# Patient Record
Sex: Female | Born: 1960 | Race: White | Hispanic: No | Smoking: Never smoker
Health system: Southern US, Community
[De-identification: ages and names within clinical notes are randomized; demographics above are authoritative.]

## PROBLEM LIST (undated history)

## (undated) DIAGNOSIS — K589 Irritable bowel syndrome without diarrhea: Secondary | ICD-10-CM

## (undated) DIAGNOSIS — K219 Gastro-esophageal reflux disease without esophagitis: Secondary | ICD-10-CM

## (undated) DIAGNOSIS — F419 Anxiety disorder, unspecified: Secondary | ICD-10-CM

## (undated) DIAGNOSIS — R42 Dizziness and giddiness: Secondary | ICD-10-CM

## (undated) DIAGNOSIS — K5190 Ulcerative colitis, unspecified, without complications: Secondary | ICD-10-CM

## (undated) DIAGNOSIS — N200 Calculus of kidney: Secondary | ICD-10-CM

## (undated) DIAGNOSIS — F329 Major depressive disorder, single episode, unspecified: Secondary | ICD-10-CM

## (undated) DIAGNOSIS — F32A Depression, unspecified: Secondary | ICD-10-CM

## (undated) DIAGNOSIS — E119 Type 2 diabetes mellitus without complications: Secondary | ICD-10-CM

## (undated) DIAGNOSIS — J329 Chronic sinusitis, unspecified: Secondary | ICD-10-CM

## (undated) HISTORY — DX: Ulcerative colitis, unspecified, without complications: K51.90

## (undated) HISTORY — DX: Depression, unspecified: F32.A

## (undated) HISTORY — DX: Irritable bowel syndrome, unspecified: K58.9

## (undated) HISTORY — DX: Major depressive disorder, single episode, unspecified: F32.9

## (undated) HISTORY — DX: Type 2 diabetes mellitus without complications: E11.9

## (undated) HISTORY — DX: Gastro-esophageal reflux disease without esophagitis: K21.9

## (undated) HISTORY — DX: Calculus of kidney: N20.0

## (undated) HISTORY — PX: NASAL ENDOSCOPY W/ BALLON SINUPLASTY: SHX2065

## (undated) HISTORY — DX: Anxiety disorder, unspecified: F41.9

## (undated) HISTORY — DX: Dizziness and giddiness: R42

## (undated) HISTORY — DX: Chronic sinusitis, unspecified: J32.9

---

## 2003-03-13 ENCOUNTER — Encounter: Payer: Self-pay | Admitting: Gastroenterology

## 2003-05-17 ENCOUNTER — Encounter (INDEPENDENT_AMBULATORY_CARE_PROVIDER_SITE_OTHER): Payer: Self-pay | Admitting: Gastroenterology

## 2004-01-16 ENCOUNTER — Encounter: Admission: RE | Admit: 2004-01-16 | Discharge: 2004-01-16 | Payer: Self-pay | Admitting: Gastroenterology

## 2005-10-21 ENCOUNTER — Ambulatory Visit: Payer: Self-pay | Admitting: Gastroenterology

## 2006-08-17 ENCOUNTER — Encounter: Payer: Self-pay | Admitting: Gastroenterology

## 2007-02-23 ENCOUNTER — Ambulatory Visit: Payer: Self-pay | Admitting: Gastroenterology

## 2008-09-06 DIAGNOSIS — K5190 Ulcerative colitis, unspecified, without complications: Secondary | ICD-10-CM | POA: Insufficient documentation

## 2008-09-07 ENCOUNTER — Ambulatory Visit: Payer: Self-pay | Admitting: Gastroenterology

## 2008-09-07 DIAGNOSIS — K589 Irritable bowel syndrome without diarrhea: Secondary | ICD-10-CM | POA: Insufficient documentation

## 2009-01-22 ENCOUNTER — Emergency Department: Payer: Self-pay | Admitting: Emergency Medicine

## 2009-12-16 ENCOUNTER — Ambulatory Visit: Payer: Self-pay | Admitting: Gastroenterology

## 2009-12-16 DIAGNOSIS — R198 Other specified symptoms and signs involving the digestive system and abdomen: Secondary | ICD-10-CM | POA: Insufficient documentation

## 2010-12-09 ENCOUNTER — Encounter (INDEPENDENT_AMBULATORY_CARE_PROVIDER_SITE_OTHER): Payer: Self-pay | Admitting: *Deleted

## 2011-01-06 NOTE — Assessment & Plan Note (Signed)
Summary: intestinal problems.Marland Kitchenem   History of Present Illness Visit Type: Follow-up Visit Primary GI MD: Joylene Igo MD [facility] Primary Provider: Jennette Bill, MD Chief Complaint: Patient c/o several weeks of what she describes as "coffee ground material" in her stool. She denies any dark black stool or brb. There is no constipation or diarrhea. She does, however complain of llq abdominal discomfort but attibutes this to increase in stress. 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 She was last seen in October 2009. She states she took Asacol for approximately one month after that visit and has been off this and off Robinul since that time with no gastrointestinal complaints.  For the past several weeks. She has noted intermittent small amounts of "coffee-ground material" on the tissue paper following a bowel movement. She notes no abnormalities in her stool and specifically denies any red blood or melena. She notes no diarrhea, constipation or mucus per rectum. She notes occasional mild left lower quadrant pain that appears to resolve following a bowel movement.   GI Review of Systems    Reports abdominal pain.     Location of  Abdominal pain: LLQ.    Denies acid reflux, belching, bloating, chest pain, dysphagia with liquids, dysphagia with solids, heartburn, loss of appetite, nausea, vomiting, vomiting blood, weight loss, and  weight gain.        Denies anal fissure, black tarry stools, change in bowel habit, constipation, diarrhea, diverticulosis, fecal incontinence, heme positive stool, hemorrhoids, irritable bowel syndrome, jaundice, light color stool, liver problems, rectal bleeding, and  rectal pain.   Current Medications (verified): 1)  Ortho-Cyclen (28) 0.25-35 Mg-Mcg Tabs (Norgestimate-Eth Estradiol) .... 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 Unremarkable  Family History: Reviewed history from 09/07/2008 and no changes required. 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  Alcohol Use - no Daily Caffeine OIZ-1-2 cups daily Illicit Drug Use - no Patient does not get regular exercise.   Review of Systems       The pertinent positives and negatives are noted as above and in the HPI. All other ROS were reviewed and were negative.   Vital Signs:  Patient profile:   50 year old female Height:      67 inches Weight:      137.38 pounds BMI:     21.59 BSA:     1.73 Pulse rate:   96 / minute Pulse rhythm:   regular BP sitting:   110 / 76  (left arm)  Vitals Entered By: Awilda Bill CMA Deborra Medina) (December 16, 2009 10:23 AM)  Physical Exam  General:  Well developed, well nourished, no acute distress. Head:  Normocephalic and atraumatic. Eyes:  PERRLA, no icterus. Ears:  Normal auditory acuity. Mouth:  No deformity or lesions, dentition normal. Lungs:  Clear throughout to auscultation. Heart:  Regular rate and rhythm; no murmurs, rubs,  or bruits. Abdomen:  Soft, nontender and nondistended. No masses, hepatosplenomegaly or hernias noted. Normal bowel sounds. Rectal:  Normal exam. hemocult negative.   Neurologic:  Alert and  oriented x4;  grossly normal neurologically.   Impression & Recommendations:  Problem # 1:  CHANGE IN BOWELS (WPY-099.83) Black "  coffee ground" material intermittently on the tissue paper. I'm uncertain as to the significance of these symptoms. Her diagnosis of ulcerative proctitis has not been well established as biopsies were not obtained. Trial of Asacol 800 mg t.i.d. If her symptoms persist for several more  weeks proceed with colonoscopy to further evaluate.  Problem # 2:  IRRITABLE BOWEL SYNDROME (ICD-564.1) Her mild left lower quadrant pain may be related to your double bowel syndrome. May use Robinul forte twice daily as needed.  Patient Instructions: 1)  Pick up your prescriptions at your pharmacy.  2)  Please continue current medications.  3)  Please schedule a follow-up appointment in 1 year and as needed 4)  Copy sent to : Jennette Bill, MD  Prescriptions: ROBINUL-FORTE 2 MG TABS (GLYCOPYRROLATE) one tablet by mouth two times a day  #60 x 11   Entered by:   Marlon Pel CMA ([place])   Authorized by:   Ladene Artist MD [facility]   Signed by:   Ladene Artist MD [facility] on 12/16/2009   Method used:   Electronically to        [facility]. [phone]* (retail)       [place], [place]  [address]       Ph: [phone] or [phone]       Fax: [phone]   RxID:   [id number] ASACOL HD 800 MG TBEC (MESALAMINE) one tablet by mouth three times a day  #90 x 11   Entered by:   Marlon Pel CMA ([place])   Authorized by:   Ladene Artist MD [facility]   Signed by:   Ladene Artist MD [facility] on 12/16/2009   Method used:   Electronically to        [facility]. [phone]* (retail)       [address] [place], [place]  [address]       Ph: [phone] or [phone]       Fax: [phone]   RxID:   [id number]

## 2011-01-06 NOTE — Letter (Signed)
Summary: [facility]   Imported By: Phillis Knack 12/16/2009 15:03:40  _____________________________________________________________________  External Attachment:    Type:   Image     Comment:   External Document

## 2011-01-06 NOTE — Letter (Signed)
Summary: NP/[facility]  NP/[facility]   Imported By: Phillis Knack 12/16/2009 15:01:48  _____________________________________________________________________  External Attachment:    Type:   Image     Comment:   External Document

## 2011-01-08 NOTE — Letter (Signed)
Summary: Office Visit Letter  [facility]  [address] [place], [place] [address]   Phone: [phone]  Fax: [phone]      December 09, 2010 MRN: [id number]   [name] [address] [place], [place]  [address]   Dear Ms. Nylen,   According to our records, it is time for you to schedule a follow-up office visit with [place].   At your convenience, please call [phone] (option #2)to schedule an office visit. If you have any questions, concerns, or feel that this letter is in error, we would appreciate your call.   Sincerely,  Norberto Sorenson T. Fuller Plan, M.D.  [facility] [phone]

## 2011-04-24 NOTE — Assessment & Plan Note (Signed)
[facility] OFFICE NOTE   MALEA, SWILLING                        MRN:          [id number]  DATE:02/23/2007                            DOB:          [birth year]    Tam says she is doing fine.  She takes Asacol one b.i.d. and some  Librax b.i.d., has no complaints, otherwise feeling good.   LABORATORY DATA:  All her labs were negative.   PHYSICAL EXAMINATION:  On physical today, she weighed 133, blood  pressure 112/80, pulse 88 and regular.  The rest is unremarkable.   IMPRESSION:  1. History of mild proctitis, doing well on the above medications.  2. IBS.  3. History of anxiety and depression.   RECOMMENDATIONS:  She is to continue with the same medications.  I gave  her prescriptions for this.  I told her that we would continue as is and  that she could transfer to Dr. Carlean Purl sometime in the near future.     Clarene Reamer, MD  Electronically Signed    SML/MedQ  DD: 02/23/2007  DT: 02/23/2007  Job #: [id number]

## 2012-03-02 ENCOUNTER — Encounter: Payer: Self-pay | Admitting: Gastroenterology

## 2012-08-22 ENCOUNTER — Encounter: Payer: Self-pay | Admitting: Gastroenterology

## 2012-10-10 ENCOUNTER — Encounter: Payer: Self-pay | Admitting: Gastroenterology

## 2012-10-10 ENCOUNTER — Ambulatory Visit (INDEPENDENT_AMBULATORY_CARE_PROVIDER_SITE_OTHER): Payer: BC Managed Care – PPO | Admitting: Gastroenterology

## 2012-10-10 VITALS — BP 130/78 | HR 84 | Ht 66.25 in | Wt 137.0 lb

## 2012-10-10 DIAGNOSIS — K5120 Ulcerative (chronic) proctitis without complications: Secondary | ICD-10-CM

## 2012-10-10 DIAGNOSIS — K589 Irritable bowel syndrome without diarrhea: Secondary | ICD-10-CM

## 2012-10-10 MED ORDER — CILIDINIUM-CHLORDIAZEPOXIDE 2.5-5 MG PO CAPS: 1.0000 | ORAL_CAPSULE | Freq: Three times a day (TID) | ORAL | Status: DC | PRN

## 2012-10-10 MED ORDER — GLYCOPYRROLATE 2 MG PO TABS: 2.0000 mg | ORAL_TABLET | Freq: Two times a day (BID) | ORAL | Status: DC | PRN

## 2012-10-10 MED ORDER — MESALAMINE 800 MG PO TBEC: 1.0000 | DELAYED_RELEASE_TABLET | Freq: Three times a day (TID) | ORAL | Status: DC

## 2012-10-10 MED ORDER — PEG-KCL-NACL-NASULF-NA ASC-C 100 G PO SOLR: 1.0000 | Freq: Once | ORAL | Status: DC

## 2012-10-10 NOTE — Patient Instructions (Addendum)
You have been scheduled for a colonoscopy with propofol. Please follow written instructions given to you at your visit today.  Please pick up your prep kit at the pharmacy within the next 1-3 days. If you use inhalers (even only as needed) or a CPAP machine, please bring them with you on the day of your procedure.  We have sent the following medications to your pharmacy for you to pick up at your convenience: Robinul and Asacol to try first then if no response from the Robinul, pick up Librax to take as needed.  cc: Dwana Melena, MD

## 2012-10-10 NOTE — Progress Notes (Signed)
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 Velora Heckler. She relates her prior symptoms were primarily that of left lower quadrant pain and rectal bleeding without diarrhea. She's also been treated for irritable bowel syndrome with episodic left lower quadrant pain. At this time she has had recurrent episodes of left lower quadrant pain that are associated with stress. I saw her last in 2011. She has been off all GI medications for about 2 years. Denies weight loss, constipation, diarrhea, change in stool caliber, melena, hematochezia, nausea, vomiting, dysphagia, reflux symptoms, chest pain.  Review of Systems: Pertinent positive and negative review of systems were noted in the above HPI section. All other review of systems were otherwise negative.  Current Medications, Allergies, Past Medical History, Past Surgical History, Family History and Social History were reviewed in [employer] record.  Physical Exam: General: Well developed , well nourished, no acute distress Head: Normocephalic and atraumatic Eyes:  sclerae anicteric, EOMI Ears: Normal auditory acuity Mouth: No deformity or lesions Neck: Supple, no masses or thyromegaly Lungs: Clear throughout to auscultation Heart: Regular rate and rhythm; no murmurs, rubs or bruits Abdomen: Soft, non tender and non distended. No masses, hepatosplenomegaly or hernias noted. Normal Bowel sounds Rectal: Deferred to colonoscopy Musculoskeletal: Symmetrical with no gross deformities  Skin: No lesions on visible extremities Pulses:  Normal pulses noted Extremities: No clubbing, cyanosis, edema or deformities noted Neurological: Alert oriented x 4, grossly nonfocal Cervical Nodes:  No significant cervical adenopathy Inguinal Nodes: No significant inguinal adenopathy Psychological:  Alert and cooperative. Normal mood and affect  Assessment and  Recommendations:  1. Ulcerative proctitis. She has not had bleeding but has had left lower quadrant pain and she would like to resume Asacol. Resume Asacol HD 800 mg 3 times a day. She is also due for colorectal cancer screening. Schedule colonoscopy. The risks, benefits, and alternatives to colonoscopy with possible biopsy and possible polypectomy were discussed with the patient and they consent to proceed.   2. Left lower quadrant pain associated with stress. Symptoms more typical for irritable bowel syndrome. Resume glycopyrrolate 2 mg twice a day when necessary. Librax 3 times a day when necessary if symptoms are refractory to Robinul.

## 2012-11-10 ENCOUNTER — Ambulatory Visit (AMBULATORY_SURGERY_CENTER): Payer: BC Managed Care – PPO | Admitting: Gastroenterology

## 2012-11-10 ENCOUNTER — Encounter: Payer: Self-pay | Admitting: Gastroenterology

## 2012-11-10 VITALS — BP 143/87 | HR 86 | Temp 97.9°F | Resp 20 | Ht 66.25 in | Wt 137.0 lb

## 2012-11-10 DIAGNOSIS — Z1211 Encounter for screening for malignant neoplasm of colon: Secondary | ICD-10-CM

## 2012-11-10 DIAGNOSIS — K5120 Ulcerative (chronic) proctitis without complications: Secondary | ICD-10-CM

## 2012-11-10 DIAGNOSIS — D126 Benign neoplasm of colon, unspecified: Secondary | ICD-10-CM

## 2012-11-10 LAB — HM COLONOSCOPY

## 2012-11-10 MED ORDER — SODIUM CHLORIDE 0.9 % IV SOLN: 500.0000 mL | INTRAVENOUS | Status: DC

## 2012-11-10 NOTE — Patient Instructions (Addendum)
YOU HAD AN ENDOSCOPIC PROCEDURE TODAY AT THE [facility]: Refer to the procedure report that was given to you for any specific questions about what was found during the examination.  If the procedure report does not answer your questions, please call your gastroenterologist to clarify.  If you requested that your care partner not be given the details of your procedure findings, then the procedure report has been included in a sealed envelope for you to review at your convenience later.  YOU SHOULD EXPECT: Some feelings of bloating in the abdomen. Passage of more gas than usual.  Walking can help get rid of the air that was put into your GI tract during the procedure and reduce the bloating. If you had a lower endoscopy (such as a colonoscopy or flexible sigmoidoscopy) you may notice spotting of blood in your stool or on the toilet paper. If you underwent a bowel prep for your procedure, then you may not have a normal bowel movement for a few days.  DIET: Your first meal following the procedure should be a light meal and then it is ok to progress to your normal diet.  A half-sandwich or bowl of soup is an example of a good first meal.  Heavy or fried foods are harder to digest and may make you feel nauseous or bloated.  Likewise meals heavy in dairy and vegetables can cause extra gas to form and this can also increase the bloating.  Drink plenty of fluids but you should avoid alcoholic beverages for 24 hours.  ACTIVITY: Your care partner should take you home directly after the procedure.  You should plan to take it easy, moving slowly for the rest of the day.  You can resume normal activity the day after the procedure however you should NOT DRIVE or use heavy machinery for 24 hours (because of the sedation medicines used during the test).    SYMPTOMS TO REPORT IMMEDIATELY: A gastroenterologist can be reached at any hour.  During normal business hours, 8:30 AM to 5:00 PM Monday through Friday,  call [phone].  After hours and on weekends, please call the GI answering service at [phone] who will take a message and have the physician on call contact you.   Following lower endoscopy (colonoscopy or flexible sigmoidoscopy):  Excessive amounts of blood in the stool  Significant tenderness or worsening of abdominal pains  Swelling of the abdomen that is new, acute  Fever of 100F or higher  Following upper endoscopy (EGD)  Vomiting of blood or coffee ground material  New chest pain or pain under the shoulder blades  Painful or persistently difficult swallowing  New shortness of breath  Fever of 100F or higher  Black, tarry-looking stools  FOLLOW UP: If any biopsies were taken you will be contacted by phone or by letter within the next 1-3 weeks.  Call your gastroenterologist if you have not heard about the biopsies in 3 weeks.  Our staff will call the home number listed on your records the next business day following your procedure to check on you and address any questions or concerns that you may have at that time regarding the information given to you following your procedure. This is a courtesy call and so if there is no answer at the home number and we have not heard from you through the emergency physician on call, we will assume that you have returned to your regular daily activities without incident.  SIGNATURES/CONFIDENTIALITY: You and/or your care   partner have signed paperwork which will be entered into your electronic medical record.  These signatures attest to the fact that that the information above on your After Visit Summary has been reviewed and is understood.  Full responsibility of the confidentiality of this discharge information lies with you and/or your care-partner.  

## 2012-11-10 NOTE — Op Note (Signed)
[facility]  [address] [place], [address]   COLONOSCOPY PROCEDURE REPORT  PATIENT: Christina, Bradley  MR#: [id number] BIRTHDATE: [birth year] , 51  yrs. old GENDER: Female ENDOSCOPIST: Ladene Artist, MD, [facility] PROCEDURE DATE:  11/10/2012 PROCEDURE:   Colonoscopy with biopsy ASA CLASS:   Class II INDICATIONS:elevated risk screening: high risk patient with previously diagnosed UC proctitis. MEDICATIONS: MAC sedation, administered by CRNA and propofol (Diprivan) 431m IV DESCRIPTION OF PROCEDURE:   After the risks benefits and alternatives of the procedure were thoroughly explained, informed consent was obtained.  A digital rectal exam revealed no abnormalities of the rectum.   The LB CF-[id number] endoscope was introduced through the anus and advanced to the cecum, which was identified by both the appendix and ileocecal valve. No adverse events experienced with a tortuous and elongated colon noted.   The quality of the prep was good, using MoviPrep  The instrument was then slowly withdrawn as the colon was fully examined.   COLON FINDINGS: A normal appearing cecum, ileocecal valve, and appendiceal orifice were identified.  The ascending, hepatic flexure, transverse, splenic flexure, descending, sigmoid colon and rectum appeared unremarkable.  No polyps or cancers were seen. Multiple random biopsies of the rectosigmoid colon were performed. Retroflexed views revealed no abnormalities. The time to cecum=5 minutes 56 seconds.  Withdrawal time=11 minutes 16 seconds.  The scope was withdrawn and the procedure completed.  COMPLICATIONS: There were no complications.  ENDOSCOPIC IMPRESSION: 1.  Normal colon; multiple random biopsies performed  RECOMMENDATIONS: 1.  Await pathology results 2.  Repeat colonoscopy in 5 years if biopsies show colitis; otherwise 10 years   eSigned:  MLadene Artist MD, [facility]12/04/2012 10:57 AM   cc: LAshok Norris MD

## 2012-11-10 NOTE — Progress Notes (Signed)
Patient did not experience any of the following events: a burn prior to discharge; a fall within the facility; wrong site/side/patient/procedure/implant event; or a hospital transfer or hospital admission upon discharge from the facility. (G8907) Patient did not have preoperative order for IV antibiotic SSI prophylaxis. (G8918)  

## 2012-11-10 NOTE — Progress Notes (Signed)
Propofol given over incremental dosages 

## 2012-11-11 ENCOUNTER — Telehealth: Payer: Self-pay | Admitting: *Deleted

## 2012-11-20 ENCOUNTER — Encounter: Payer: Self-pay | Admitting: Gastroenterology

## 2013-07-28 ENCOUNTER — Telehealth: Payer: Self-pay | Admitting: Gastroenterology

## 2013-07-28 DIAGNOSIS — K589 Irritable bowel syndrome without diarrhea: Secondary | ICD-10-CM

## 2013-07-28 DIAGNOSIS — K5120 Ulcerative (chronic) proctitis without complications: Secondary | ICD-10-CM

## 2013-07-28 MED ORDER — CILIDINIUM-CHLORDIAZEPOXIDE 2.5-5 MG PO CAPS: 1.0000 | ORAL_CAPSULE | Freq: Three times a day (TID) | ORAL | Status: DC | PRN

## 2013-07-28 MED ORDER — MESALAMINE 800 MG PO TBEC: 1.0000 | DELAYED_RELEASE_TABLET | Freq: Three times a day (TID) | ORAL | Status: DC

## 2013-07-28 NOTE — Telephone Encounter (Signed)
Prescription sent to patient's pharmacy.

## 2013-12-07 HISTORY — PX: NASAL SINUS SURGERY: SHX719

## 2013-12-22 ENCOUNTER — Ambulatory Visit: Payer: Self-pay | Admitting: Otolaryngology

## 2015-06-19 ENCOUNTER — Telehealth: Payer: Self-pay | Admitting: Gastroenterology

## 2015-06-19 NOTE — Telephone Encounter (Signed)
I was not able to speak with the patient.   I did give her a tentative appt for tomorrow at 9:30 with Nicoletta Ba PA  I left her a message that I was sorry I did not reach her earlier in the day, left her the appt info for tomorrow and asked that she call back if she could not make that appt at short notice.

## 2015-06-20 ENCOUNTER — Ambulatory Visit: Payer: Self-pay | Admitting: Physician Assistant

## 2015-06-20 NOTE — Telephone Encounter (Signed)
Patient called and not able to come this am.  She is rescheduled to tomorrow pm with Arta Bruce, PA

## 2015-06-21 ENCOUNTER — Other Ambulatory Visit (INDEPENDENT_AMBULATORY_CARE_PROVIDER_SITE_OTHER): Payer: BLUE CROSS/BLUE SHIELD

## 2015-06-21 ENCOUNTER — Ambulatory Visit (INDEPENDENT_AMBULATORY_CARE_PROVIDER_SITE_OTHER): Payer: BLUE CROSS/BLUE SHIELD | Admitting: Physician Assistant

## 2015-06-21 ENCOUNTER — Encounter: Payer: Self-pay | Admitting: Physician Assistant

## 2015-06-21 VITALS — BP 140/90 | HR 92 | Ht 66.0 in | Wt 136.0 lb

## 2015-06-21 DIAGNOSIS — K219 Gastro-esophageal reflux disease without esophagitis: Secondary | ICD-10-CM

## 2015-06-21 DIAGNOSIS — K589 Irritable bowel syndrome without diarrhea: Secondary | ICD-10-CM

## 2015-06-21 DIAGNOSIS — R109 Unspecified abdominal pain: Secondary | ICD-10-CM

## 2015-06-21 LAB — CBC WITH DIFFERENTIAL/PLATELET
BASOS ABS: 0.1 10*3/uL (ref 0.0–0.1)
Basophils Relative: 0.7 % (ref 0.0–3.0)
EOS ABS: 0.3 10*3/uL (ref 0.0–0.7)
EOS PCT: 2.7 % (ref 0.0–5.0)
HCT: 45.1 % (ref 36.0–46.0)
HEMOGLOBIN: 15.3 g/dL — AB (ref 12.0–15.0)
LYMPHS ABS: 2.4 10*3/uL (ref 0.7–4.0)
LYMPHS PCT: 19.8 % (ref 12.0–46.0)
MCHC: 33.9 g/dL (ref 30.0–36.0)
MCV: 92.6 fl (ref 78.0–100.0)
MONOS PCT: 5.5 % (ref 3.0–12.0)
Monocytes Absolute: 0.7 10*3/uL (ref 0.1–1.0)
NEUTROS PCT: 71.3 % (ref 43.0–77.0)
Neutro Abs: 8.5 10*3/uL — ABNORMAL HIGH (ref 1.4–7.7)
PLATELETS: 235 10*3/uL (ref 150.0–400.0)
RBC: 4.88 Mil/uL (ref 3.87–5.11)
RDW: 13.4 % (ref 11.5–15.5)
WBC: 11.9 10*3/uL — ABNORMAL HIGH (ref 4.0–10.5)

## 2015-06-21 LAB — SEDIMENTATION RATE: SED RATE: 9 mm/h (ref 0–22)

## 2015-06-21 MED ORDER — CILIDINIUM-CHLORDIAZEPOXIDE 2.5-5 MG PO CAPS: 1.0000 | ORAL_CAPSULE | Freq: Three times a day (TID) | ORAL | Status: DC | PRN

## 2015-06-21 MED ORDER — RABEPRAZOLE SODIUM 20 MG PO TBEC: DELAYED_RELEASE_TABLET | ORAL | Status: DC

## 2015-06-21 NOTE — Patient Instructions (Addendum)
Your physician has requested that you go to the basement for the following lab work before leaving today:CBC, Sed rate.   We have sent the following medications to your pharmacy for you to pick up at your convenience:Librax and rabeprazole.  Patient advised to avoid spicy, acidic, citrus, chocolate, mints, fruit and fruit juices.  Limit the intake of caffeine, alcohol and Soda.  Don't exercise too soon after eating.  Don't lie down within 3-4 hours of eating.  Elevate the head of your bed.  Increase your water intake.   Avoid eating 2 hours before bedtime.   Your follow up appointment with Dr. Fuller Plan is on 08/09/15 at 9:00am. If you need to reschedule or cancel please call our office at [phone].

## 2015-06-21 NOTE — Progress Notes (Signed)
Patient ID: Christina Bradley, female   DOB: Nov 29, 1961, 54 y.o.   MRN: [id number]     History of Present Illness: Christina Bradley is a pleasant 54 year old female who is known to Dr. Fuller Plan. 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 She reports that she had a sigmoidoscopy in 2004 but no biopsies were done. She states that she took asacol  for approximately 1 month in October 2009 with resolution of her symptoms of abdominal cramping and diarrhea. She was seen in 2011 with complaints of black "coffee ground" material intermittently on the tissue paper with bowel movements. She was again given a trial of these call 800 mg 3 times a day. She was prescribed Robinul for IBS. She had a colonoscopy in December 2013 which was a normal colonoscopy. Multiple random biopsies were performed and were normal tissue. She states that she has been feeling well and has not used [place] call in over 3-1/2 years. She has not used her Robinul Forte in over 3 years. She has not used her Tye Savoy in over 3 years. She is here today because she states that she notices that over the past 1-2 months when she eats she sometimes has bloating and left-sided cramping that last until she passes gas or has a bowel movement. When she wakes up in the morning she feels like she has a stitch in her left side that is alleviated with passage of gas or a bowel movement. She is unable to identify any specific foods that exacerbate her symptoms. She has no diarrhea. She is having one formed bowel movement daily. She has no bright red blood per rectum or melena. She rarely has mucus with her stools. Her appetite has been good and her weight has been stable. She does feel very gassy. She has no nausea but feels bloating across the upper abdomen. She has not belching or burping and denies heartburn. She denies nocturnal regurgitation but states for the past month when she wakes up in the morning she feels  like she has vomitus in her mouth. She denies early satiety and denies tenesmus.   Past Medical History  Diagnosis Date  . Depression     pt denies  . Kidney stones   . UC (ulcerative colitis)   . Anxiety     pt denies    Past Surgical History  Procedure Laterality Date  . Nasal endoscopy w/ ballon sinuplasty     Family History  Problem Relation Age of Onset  . Colon cancer Neg Hx    History  Substance Use Topics  . Smoking status: Never Smoker   . Smokeless tobacco: Never Used  . Alcohol Use: No   Current Outpatient Prescriptions  Medication Sig Dispense Refill  . clidinium-chlordiazePOXIDE (LIBRAX) 5-2.5 MG per capsule Take 1 capsule by mouth 3 (three) times daily as needed. 90 capsule 5  . RABEprazole (ACIPHEX) 20 MG tablet Take one tablet by mouth 30 minutes before breakfast 30 tablet 11   No current facility-administered medications for this visit.   No Known Allergies    Review of Systems: Gen: Denies any fever, chills, sweats, anorexia, fatigue, weakness, malaise, weight loss, and sleep disorder CV: Denies chest pain, angina, palpitations, syncope, orthopnea, PND, peripheral edema, and claudication. Resp: Denies dyspnea at rest, dyspnea with exercise, cough, sputum, wheezing, coughing up blood, and pleurisy. GI: Denies vomiting blood, jaundice, and fecal incontinence.   Denies dysphagia or odynophagia. GU :  Denies urinary burning, blood in urine, urinary frequency, urinary hesitancy, nocturnal urination, and urinary incontinence. MS: Denies joint pain, limitation of movement, and swelling, stiffness, low back pain, extremity pain. Denies muscle weakness, cramps, atrophy.  Derm: Denies rash, itching, dry skin, hives, moles, warts, or unhealing ulcers.  Psych: Denies depression, anxiety, memory loss, suicidal ideation, hallucinations, paranoia, and confusion. Heme: Denies bruising, bleeding, and enlarged lymph nodes. Neuro:  Denies any headaches, dizziness,  paresthesia Endo:  Denies any problems with DM, thyroid, adrenal  LAB RESULTS:  Recent Labs  06/21/15 1517  WBC 11.9*  HGB 15.3*  HCT 45.1  PLT 235.0     Physical Exam: General: Pleasant, well developed female in no acute distress Head: Normocephalic and atraumatic Eyes:  sclerae anicteric, conjunctiva pink  Ears: Normal auditory acuity Lungs: Clear throughout to auscultation Heart: Regular rate and rhythm Abdomen: Soft, non distended, non-tender. No masses, no hepatomegaly. Normal bowel sounds Musculoskeletal: Symmetrical with no gross deformities  Extremities: No edema  Neurological: Alert oriented x 4, grossly nonfocal Psychological:  Alert and cooperative. Normal mood and affect  Assessment and Recommendations: 54 year old female with a history of IBS and a questionable history of ulcerative colitis who has been off medications for several years, here for evaluation of 1-2 months of postprandial cramping and bloating. Her symptoms are likely functional in nature. A CBC, and ESR will be obtained today. She will use her Truman Hayward Brack's 3 times daily as needed. She's been instructed to adhere to a high-fiber low-fat diet. An antireflux regimen has been reviewed and she will be given a trial of rabeprazole 20 mg 1 by mouth every morning 30 minutes prior to breakfast. She's been instructed to avoid eating for 2 hours before bedtime. She will return for reevaluation in 6-8 weeks, sooner if needed.         Devon Pretty, Deloris Ping 06/21/2015,

## 2015-06-22 NOTE — Progress Notes (Signed)
Reviewed and agree with management plan.  Montrell Cessna T. Zyona Pettaway, MD FACG 

## 2015-06-24 ENCOUNTER — Other Ambulatory Visit: Payer: Self-pay | Admitting: *Deleted

## 2015-06-24 DIAGNOSIS — D72829 Elevated white blood cell count, unspecified: Secondary | ICD-10-CM

## 2015-06-25 ENCOUNTER — Other Ambulatory Visit (INDEPENDENT_AMBULATORY_CARE_PROVIDER_SITE_OTHER): Payer: BLUE CROSS/BLUE SHIELD

## 2015-06-25 DIAGNOSIS — D72829 Elevated white blood cell count, unspecified: Secondary | ICD-10-CM | POA: Diagnosis not present

## 2015-06-25 LAB — CBC WITH DIFFERENTIAL/PLATELET
BASOS PCT: 0.6 % (ref 0.0–3.0)
Basophils Absolute: 0 10*3/uL (ref 0.0–0.1)
EOS PCT: 7.8 % — AB (ref 0.0–5.0)
Eosinophils Absolute: 0.5 10*3/uL (ref 0.0–0.7)
HCT: 42.9 % (ref 36.0–46.0)
Hemoglobin: 14.5 g/dL (ref 12.0–15.0)
Lymphocytes Relative: 29.3 % (ref 12.0–46.0)
Lymphs Abs: 1.8 10*3/uL (ref 0.7–4.0)
MCHC: 33.8 g/dL (ref 30.0–36.0)
MCV: 93.1 fl (ref 78.0–100.0)
Monocytes Absolute: 0.5 10*3/uL (ref 0.1–1.0)
Monocytes Relative: 7.4 % (ref 3.0–12.0)
Neutro Abs: 3.4 10*3/uL (ref 1.4–7.7)
Neutrophils Relative %: 54.9 % (ref 43.0–77.0)
Platelets: 216 10*3/uL (ref 150.0–400.0)
RBC: 4.61 Mil/uL (ref 3.87–5.11)
RDW: 13.3 % (ref 11.5–15.5)
WBC: 6.3 10*3/uL (ref 4.0–10.5)

## 2015-08-09 ENCOUNTER — Ambulatory Visit: Payer: BLUE CROSS/BLUE SHIELD | Admitting: Gastroenterology

## 2015-08-16 ENCOUNTER — Ambulatory Visit (INDEPENDENT_AMBULATORY_CARE_PROVIDER_SITE_OTHER): Payer: BLUE CROSS/BLUE SHIELD | Admitting: Gastroenterology

## 2015-08-16 ENCOUNTER — Encounter: Payer: Self-pay | Admitting: Gastroenterology

## 2015-08-16 VITALS — BP 118/70 | HR 76 | Ht 66.25 in | Wt 136.5 lb

## 2015-08-16 DIAGNOSIS — K589 Irritable bowel syndrome without diarrhea: Secondary | ICD-10-CM

## 2015-08-16 DIAGNOSIS — R1032 Left lower quadrant pain: Secondary | ICD-10-CM

## 2015-08-16 NOTE — Progress Notes (Signed)
    History of Present Illness: This is a 54 year old female returning for follow-up of IBS. Currently symptoms are well controlled on Align daily and Librax prn. She took rabeprazole for one month but did not notice any improvement in symptoms. Align has provided significant relief of her symptoms and she has only used Librax once or twice over the past month. She notes left lower quadrant bloating and discomfort that is relieved by the passage of flatus. She notices these symptoms frequently in the mornings.  Current Medications, Allergies, Past Medical History, Past Surgical History, Family History and Social History were reviewed in [employer] record.  Physical Exam: General: Well developed , well nourished, no acute distress Head: Normocephalic and atraumatic Eyes:  sclerae anicteric, EOMI Ears: Normal auditory acuity Mouth: No deformity or lesions Lungs: Clear throughout to auscultation Heart: Regular rate and rhythm; no murmurs, rubs or bruits Abdomen: Soft, non tender and non distended. No masses, hepatosplenomegaly or hernias noted. Normal Bowel sounds Musculoskeletal: Symmetrical with no gross deformities  Pulses:  Normal pulses noted Extremities: No clubbing, cyanosis, edema or deformities noted Neurological: Alert oriented x 4, grossly nonfocal Psychological:  Alert and cooperative. Normal mood and affect  Assessment and Recommendations:  1. IBS. Continue Align daily and Librax tid prn. Gas-X 4 times a day when necessary. I answered several questions regarding long-term management. Follow-up in 1 year and as needed.  2. History of ulcerative proctitis. Currently no symptoms suggestive of ulcerative colitis/proctitis. No colitis activity at colonoscopy in December 2013.  I spent 15 minutes of face-to-face time with the patient. Greater than 50% of the time was spent counseling and coordinating care.

## 2015-08-16 NOTE — Patient Instructions (Addendum)
Please purchase the following medications over the counter and take as directed: Gas-X four times a day as needed  Please continue taking Align daily

## 2015-11-29 ENCOUNTER — Ambulatory Visit (INDEPENDENT_AMBULATORY_CARE_PROVIDER_SITE_OTHER): Payer: BLUE CROSS/BLUE SHIELD | Admitting: Family Medicine

## 2015-11-29 ENCOUNTER — Encounter: Payer: Self-pay | Admitting: Family Medicine

## 2015-11-29 VITALS — BP 142/96 | HR 100 | Temp 97.7°F | Resp 18 | Ht 67.0 in | Wt 139.3 lb

## 2015-11-29 DIAGNOSIS — K589 Irritable bowel syndrome without diarrhea: Secondary | ICD-10-CM

## 2015-11-29 DIAGNOSIS — F411 Generalized anxiety disorder: Secondary | ICD-10-CM | POA: Insufficient documentation

## 2015-11-29 DIAGNOSIS — IMO0001 Reserved for inherently not codable concepts without codable children: Secondary | ICD-10-CM | POA: Insufficient documentation

## 2015-11-29 DIAGNOSIS — R030 Elevated blood-pressure reading, without diagnosis of hypertension: Secondary | ICD-10-CM | POA: Diagnosis not present

## 2015-11-29 DIAGNOSIS — R1032 Left lower quadrant pain: Secondary | ICD-10-CM

## 2015-11-29 DIAGNOSIS — K219 Gastro-esophageal reflux disease without esophagitis: Secondary | ICD-10-CM | POA: Insufficient documentation

## 2015-11-29 MED ORDER — ALPRAZOLAM 0.5 MG PO TABS: 0.5000 mg | ORAL_TABLET | Freq: Every evening | ORAL | Status: DC | PRN

## 2015-11-29 MED ORDER — DULOXETINE HCL 30 MG PO CPEP
30.0000 mg | ORAL_CAPSULE | Freq: Every day | ORAL | Status: DC
Start: 2015-11-29 — End: 2016-02-27

## 2015-11-29 NOTE — Progress Notes (Signed)
Name: Christina Bradley   MRN: [id number]    DOB: October 31, 1961   Date:11/29/2015       Progress Note  Subjective  Chief Complaint  Chief Complaint  Patient presents with  . Irritable Bowel Syndrome    GI Doctor-want patient to see her Internal Medicine doctor thinking her GI problems are due to stress.   . Stress    Husband stress due to his poor health.      HPI  IBS : she has a history of ulcerative colitis, but seen by GI recently and advised to take something for anxiety to control her symptoms of pain. No diarrhea or constipation, occasionally mucus on stools, but no blood. She is taking Align and prn medication for spasms. Pain is described as sharp, and usually on the left side, it can last up to all day sometimes, improves when she passes flatus, also when she has a bowel movement. She states episodes are intermittent, but more frequent since June. No bladder issues, no previous history of kidney stones. She states symptoms are a little better than during the Summer  Stress/Anxiety: her husband has multiple medication issues and she tries to do everything for him, she worries. GAD filled out.   White Coat Hypertension: she states that bp is up when she goes to doctors office, but usually normal range. Last bp at GI doctors was 118/70, it is her first visit in our office.    Patient Active Problem List   Diagnosis Date Noted  . White coat hypertension 11/29/2015  . IBS (irritable bowel syndrome) 09/07/2008  . Ulcerative colitis ([place]) 09/06/2008    Past Surgical History  Procedure Laterality Date  . Nasal endoscopy w/ ballon sinuplasty    . Nasal sinus surgery  2015    Family History  Problem Relation Age of Onset  . Colon cancer Neg Hx     Social History   Social History  . Marital Status: Married    Spouse Name: N/A  . Number of Children: 2  . Years of Education: N/A   Occupational History  . Administrative    Social History Main Topics  . Smoking status: Never  Smoker   . Smokeless tobacco: Never Used  . Alcohol Use: 0.0 oz/week    0 Standard drinks or equivalent per week     Comment: occasionally   . Drug Use: No  . Sexual Activity:    Partners: Male   Other Topics Concern  . Not on file   Social History Narrative     Current outpatient prescriptions:  .  clidinium-chlordiazePOXIDE (LIBRAX) 5-2.5 MG per capsule, Take 1 capsule by mouth 3 (three) times daily as needed., Disp: 90 capsule, Rfl: 5 .  Probiotic Product (ALIGN PO), Take by mouth., Disp: , Rfl:  .  RABEprazole (ACIPHEX) 20 MG tablet, TK 1 T PO 30 MIN B BRE, Disp: , Rfl:   No Known Allergies   ROS  Constitutional: Negative for fever or weight change.  Respiratory: Negative for cough and shortness of breath.   Cardiovascular: Negative for chest pain or palpitations.  Gastrointestinal: Positive for abdominal pain, no bowel changes.  Musculoskeletal: Negative for gait problem or joint swelling.  Skin: Negative for rash.  Neurological: Negative for dizziness or headache.  No other specific complaints in a complete review of systems (except as listed in HPI above).  Objective  Filed Vitals:   11/29/15 1523  BP: 142/96  Pulse: 100  Temp: 97.7 F (36.5 C)  TempSrc: Oral  Resp: 18  Height: 5' 7"  (1.702 m)  Weight: 139 lb 4.8 oz (63.186 kg)  SpO2: 97%    Body mass index is 21.81 kg/(m^2).  Physical Exam  Constitutional: Patient appears well-developed and well-nourished. No distress.  HEENT: head atraumatic, normocephalic, pupils equal and reactive to light,  neck supple, throat within normal limits Cardiovascular: Normal rate, regular rhythm and normal heart sounds.  No murmur heard. No BLE edema. Pulmonary/Chest: Effort normal and breath sounds normal. No respiratory distress. Abdominal: Soft.  Mild fullness and tenderness on LLQ, otherwise normal exam Psychiatric: Patient has a normal mood and affect. behavior is normal. Judgment and thought content  normal.  PHQ2/9: Depression screen PHQ 2/9 11/29/2015  Decreased Interest 0  Down, Depressed, Hopeless 0  PHQ - 2 Score 0    Fall Risk: Fall Risk  11/29/2015  Falls in the past year? No    Functional Status Survey: Is the patient deaf or have difficulty hearing?: No Does the patient have difficulty seeing, even when wearing glasses/contacts?: Yes (glasses) Does the patient have difficulty concentrating, remembering, or making decisions?: No Does the patient have difficulty walking or climbing stairs?: No Does the patient have difficulty dressing or bathing?: No Does the patient have difficulty doing errands alone such as visiting a doctor's office or shopping?: No    Assessment & Plan   1. IBS (irritable bowel syndrome)  - DULoxetine (CYMBALTA) 30 MG capsule; Take 1-2 capsules (30-60 mg total) by mouth daily. And after the first go up to two daily  Dispense: 60 capsule; Refill: 0  2. White coat hypertension  Monitor bp   3. GAD (generalized anxiety disorder)  - DULoxetine (CYMBALTA) 30 MG capsule; Take 1-2 capsules (30-60 mg total) by mouth daily. And after the first go up to two daily  Dispense: 60 capsule; Refill: 0 - ALPRAZolam (XANAX) 0.5 MG tablet; Take 1 tablet (0.5 mg total) by mouth at bedtime as needed for anxiety.  Dispense: 30 tablet; Refill: 0  4. Left lower quadrant pain  - US Pelvis Complete; Future

## 2015-12-04 ENCOUNTER — Ambulatory Visit
Admission: RE | Admit: 2015-12-04 | Discharge: 2015-12-04 | Disposition: A | Discharge status: Home / Self Care | Payer: BLUE CROSS/BLUE SHIELD | Source: Ambulatory Visit | Attending: Family Medicine | Admitting: Family Medicine

## 2015-12-04 DIAGNOSIS — K589 Irritable bowel syndrome without diarrhea: Secondary | ICD-10-CM | POA: Diagnosis not present

## 2015-12-04 DIAGNOSIS — R1032 Left lower quadrant pain: Secondary | ICD-10-CM

## 2016-02-27 ENCOUNTER — Other Ambulatory Visit: Payer: Self-pay | Admitting: Family Medicine

## 2016-09-10 ENCOUNTER — Other Ambulatory Visit: Payer: Self-pay | Admitting: Family Medicine

## 2016-09-11 NOTE — Telephone Encounter (Signed)
Patient requesting refill of Cymbalta to [facility].

## 2016-11-18 ENCOUNTER — Encounter: Payer: Self-pay | Admitting: Family Medicine

## 2016-11-18 ENCOUNTER — Ambulatory Visit (INDEPENDENT_AMBULATORY_CARE_PROVIDER_SITE_OTHER): Payer: BLUE CROSS/BLUE SHIELD | Admitting: Family Medicine

## 2016-11-18 ENCOUNTER — Ambulatory Visit: Payer: BLUE CROSS/BLUE SHIELD | Admitting: Family Medicine

## 2016-11-18 VITALS — BP 132/68 | HR 103 | Temp 98.8°F | Resp 16 | Ht 67.0 in | Wt 148.0 lb

## 2016-11-18 DIAGNOSIS — K589 Irritable bowel syndrome without diarrhea: Secondary | ICD-10-CM | POA: Diagnosis not present

## 2016-11-18 DIAGNOSIS — K5180 Other ulcerative colitis without complications: Secondary | ICD-10-CM

## 2016-11-18 DIAGNOSIS — F411 Generalized anxiety disorder: Secondary | ICD-10-CM

## 2016-11-18 MED ORDER — DULOXETINE HCL 30 MG PO CPEP: 30.0000 mg | ORAL_CAPSULE | Freq: Every day | ORAL | 0 refills | Status: DC

## 2016-11-18 NOTE — Progress Notes (Signed)
Name: Christina Bradley   MRN: [id number]    DOB: June 06, 1961   Date:11/18/2016       Progress Note  Subjective  Chief Complaint  Chief Complaint  Patient presents with  . Medication Refill  . Anxiety  . Irritable Bowel Syndrome    HPI  IBS : she has a history of ulcerative colitis, but when she was seen  by GI in 2016 she was  advised to take something for anxiety to control her symptoms of recurrent abdominal  pain. No diarrhea or constipation, occasionally mucus on stools, but no blood. She was taking Align and prn medication for spasms. Pain is described as sharp, and usually on the left side, it can last up to all day sometimes, improves when she passes flatus, also when she has a bowel movement. She states episodes are intermittent, but were more frequent the end of 2016. Symptoms seems to be aggravated by stress and not associated with  bladder issues, no previous history of kidney stones. We gave her Cymbalta 30 mg, December 2017 to try to titrate up to 60 mg. She states she is feeling very well on medication. She had only 4 episodes of abdominal pain over the past year. She takes prn - when stressed, but when she takes it she takes it for a prolonged period of time. She is aware that medication takes time to kick in her system. She would like a refills. She denies sadness or panic attacks. Never filled rx of alprazolam   GAD: doing very well on Cymbalta, worries less often , coping better with medication and no side effects. Advised to take medication and titrate up to 60 mg daily if stress increases  Ulcerative colitis: no current symptoms. Sees GI  Patient Active Problem List   Diagnosis Date Noted  . White coat hypertension 11/29/2015  . GAD (generalized anxiety disorder) 11/29/2015  . GERD without esophagitis 11/29/2015  . IBS (irritable bowel syndrome) 09/07/2008  . Ulcerative colitis ([place]) 09/06/2008    Past Surgical History:  Procedure Laterality Date  . NASAL ENDOSCOPY W/  BALLON SINUPLASTY    . NASAL SINUS SURGERY  2015    Family History  Problem Relation Age of Onset  . Hyperlipidemia Mother   . Heart disease Mother     rapid heart beat  . COPD Father   . Colon cancer Neg Hx     Social History   Social History  . Marital status: Married    Spouse name: N/A  . Number of children: 2  . Years of education: N/A   Occupational History  . Administrative    Social History Main Topics  . Smoking status: Never Smoker  . Smokeless tobacco: Never Used  . Alcohol use 1.2 oz/week    2 Glasses of wine per week     Comment: occasionally   . Drug use: No  . Sexual activity: Yes    Partners: Male   Other Topics Concern  . Not on file   Social History Narrative  . No narrative on file     Current Outpatient Prescriptions:  .  DULoxetine (CYMBALTA) 30 MG capsule, Take 1-2 capsules (30-60 mg total) by mouth daily., Disp: 180 capsule, Rfl: 0 .  Probiotic Product (ALIGN PO), Take by mouth., Disp: , Rfl:  .  clidinium-chlordiazePOXIDE (LIBRAX) 5-2.5 MG per capsule, Take 1 capsule by mouth 3 (three) times daily as needed. (Patient not taking: Reported on 11/18/2016), Disp: 90 capsule, Rfl: 5  No  Known Allergies   ROS  Constitutional: Negative for fever or weight change.  Respiratory: Negative for cough and shortness of breath.   Cardiovascular: Negative for chest pain or palpitations.  Gastrointestinal: Negative for abdominal pain, no bowel changes.  Musculoskeletal: Negative for gait problem or joint swelling.  Skin: Negative for rash.  Neurological: Negative for dizziness or headache.  No other specific complaints in a complete review of systems (except as listed in HPI above).  Objective  Vitals:   11/18/16 1224  BP: 132/68  Pulse: (!) 103  Resp: 16  Temp: 98.8 F (37.1 C)  SpO2: 97%  Weight: 148 lb (67.1 kg)  Height: 5' 7"  (1.702 m)    Body mass index is 23.18 kg/m.  Physical Exam  Constitutional: Patient appears  well-developed and well-nourished.  No distress.  HEENT: head atraumatic, normocephalic, pupils equal and reactive to light, neck supple, throat within normal limits Cardiovascular: Normal rate, regular rhythm and normal heart sounds.  No murmur heard. No BLE edema. Pulmonary/Chest: Effort normal and breath sounds normal. No respiratory distress. Abdominal: Soft.  There is no tenderness. Psychiatric: Patient has a normal mood and affect. behavior is normal. Judgment and thought content normal.  PHQ2/9: Depression screen [facility] 2/9 11/18/2016 11/29/2015  Decreased Interest 0 0  Down, Depressed, Hopeless 0 0  PHQ - 2 Score 0 0    Fall Risk: Fall Risk  11/18/2016 11/29/2015  Falls in the past year? No No     Functional Status Survey: Is the patient deaf or have difficulty hearing?: No Does the patient have difficulty seeing, even when wearing glasses/contacts?: No Does the patient have difficulty concentrating, remembering, or making decisions?: No Does the patient have difficulty walking or climbing stairs?: No Does the patient have difficulty dressing or bathing?: No Does the patient have difficulty doing errands alone such as visiting a doctor's office or shopping?: No    Assessment & Plan  1. GAD (generalized anxiety disorder)  - DULoxetine (CYMBALTA) 30 MG capsule; Take 1-2 capsules (30-60 mg total) by mouth daily.  Dispense: 180 capsule; Refill: 0  2. Irritable bowel syndrome, unspecified type  Doing well on Cymbalta  3. Other ulcerative colitis without complication (HCC)  Seeing GI, going well

## 2016-12-16 ENCOUNTER — Ambulatory Visit: Payer: BLUE CROSS/BLUE SHIELD | Admitting: Family Medicine

## 2017-02-09 ENCOUNTER — Telehealth: Payer: Self-pay | Admitting: Emergency Medicine

## 2017-02-09 NOTE — Telephone Encounter (Signed)
Patient notified

## 2017-02-09 NOTE — Telephone Encounter (Signed)
I don't call in antibiotics. I am sorry

## 2017-02-09 NOTE — Telephone Encounter (Signed)
Have a sinus infection. Can a Zpak be called into pharmacy.

## 2017-08-11 ENCOUNTER — Other Ambulatory Visit: Payer: Self-pay | Admitting: Family Medicine

## 2017-08-11 DIAGNOSIS — F411 Generalized anxiety disorder: Secondary | ICD-10-CM

## 2017-08-11 MED ORDER — DULOXETINE HCL 30 MG PO CPEP: 30.0000 mg | ORAL_CAPSULE | Freq: Every day | ORAL | 0 refills | Status: DC

## 2017-08-13 ENCOUNTER — Other Ambulatory Visit: Payer: Self-pay | Admitting: Family Medicine

## 2017-08-13 ENCOUNTER — Other Ambulatory Visit: Payer: Self-pay

## 2017-08-13 DIAGNOSIS — F411 Generalized anxiety disorder: Secondary | ICD-10-CM

## 2017-08-13 MED ORDER — DULOXETINE HCL 30 MG PO CPEP: 30.0000 mg | ORAL_CAPSULE | Freq: Every day | ORAL | 0 refills | Status: DC

## 2017-08-13 NOTE — Progress Notes (Signed)
Patient called and states Dr. Ancil Boozer told patient she would only have to come once a year. She has an upcoming appointment in December 2018. Please cancel the 30 day prescription at [facility] and send in a new prescription.  Sent in new prescription per Dr. Ancil Boozer verbally to [facility] pharmacy to [place] for 90 pills of Duloxetine with 0 refills.

## 2017-08-13 NOTE — Telephone Encounter (Signed)
Patient requesting refill of Duloxetine to [facility] pharmacy.

## 2017-12-13 ENCOUNTER — Ambulatory Visit (INDEPENDENT_AMBULATORY_CARE_PROVIDER_SITE_OTHER): Payer: 59 | Admitting: Family Medicine

## 2017-12-13 ENCOUNTER — Encounter: Payer: Self-pay | Admitting: Family Medicine

## 2017-12-13 VITALS — BP 128/76 | HR 108 | Temp 97.9°F | Resp 16 | Ht 66.25 in | Wt 156.6 lb

## 2017-12-13 DIAGNOSIS — Z124 Encounter for screening for malignant neoplasm of cervix: Secondary | ICD-10-CM

## 2017-12-13 DIAGNOSIS — K5180 Other ulcerative colitis without complications: Secondary | ICD-10-CM | POA: Diagnosis not present

## 2017-12-13 DIAGNOSIS — Z01419 Encounter for gynecological examination (general) (routine) without abnormal findings: Secondary | ICD-10-CM

## 2017-12-13 DIAGNOSIS — Z01411 Encounter for gynecological examination (general) (routine) with abnormal findings: Secondary | ICD-10-CM | POA: Diagnosis not present

## 2017-12-13 DIAGNOSIS — J329 Chronic sinusitis, unspecified: Secondary | ICD-10-CM | POA: Diagnosis not present

## 2017-12-13 DIAGNOSIS — K589 Irritable bowel syndrome without diarrhea: Secondary | ICD-10-CM

## 2017-12-13 DIAGNOSIS — Z1239 Encounter for other screening for malignant neoplasm of breast: Secondary | ICD-10-CM

## 2017-12-13 DIAGNOSIS — F411 Generalized anxiety disorder: Secondary | ICD-10-CM | POA: Diagnosis not present

## 2017-12-13 MED ORDER — DULOXETINE HCL 30 MG PO CPEP: 30.0000 mg | ORAL_CAPSULE | Freq: Every day | ORAL | 4 refills | Status: DC

## 2017-12-13 NOTE — Patient Instructions (Signed)
Preventive Care 40-64 Years, Female Preventive care refers to lifestyle choices and visits with your health care provider that can promote health and wellness. What does preventive care include?  A yearly physical exam. This is also called an annual well check.  Dental exams once or twice a year.  Routine eye exams. Ask your health care provider how often you should have your eyes checked.  Personal lifestyle choices, including: ? Daily care of your teeth and gums. ? Regular physical activity. ? Eating a healthy diet. ? Avoiding tobacco and drug use. ? Limiting alcohol use. ? Practicing safe sex. ? Taking low-dose aspirin daily starting at age 58. ? Taking vitamin and mineral supplements as recommended by your health care provider. What happens during an annual well check? The services and screenings done by your health care provider during your annual well check will depend on your age, overall health, lifestyle risk factors, and family history of disease. Counseling Your health care provider may ask you questions about your:  Alcohol use.  Tobacco use.  Drug use.  Emotional well-being.  Home and relationship well-being.  Sexual activity.  Eating habits.  Work and work Statistician.  Method of birth control.  Menstrual cycle.  Pregnancy history.  Screening You may have the following tests or measurements:  Height, weight, and BMI.  Blood pressure.  Lipid and cholesterol levels. These may be checked every 5 years, or more frequently if you are over 81 years old.  Skin check.  Lung cancer screening. You may have this screening every year starting at age 78 if you have a 30-pack-year history of smoking and currently smoke or have quit within the past 15 years.  Fecal occult blood test (FOBT) of the stool. You may have this test every year starting at age 65.  Flexible sigmoidoscopy or colonoscopy. You may have a sigmoidoscopy every 5 years or a colonoscopy  every 10 years starting at age 30.  Hepatitis C blood test.  Hepatitis B blood test.  Sexually transmitted disease (STD) testing.  Diabetes screening. This is done by checking your blood sugar (glucose) after you have not eaten for a while (fasting). You may have this done every 1-3 years.  Mammogram. This may be done every 1-2 years. Talk to your health care provider about when you should start having regular mammograms. This may depend on whether you have a family history of breast cancer.  BRCA-related cancer screening. This may be done if you have a family history of breast, ovarian, tubal, or peritoneal cancers.  Pelvic exam and Pap test. This may be done every 3 years starting at age 80. Starting at age 36, this may be done every 5 years if you have a Pap test in combination with an HPV test.  Bone density scan. This is done to screen for osteoporosis. You may have this scan if you are at high risk for osteoporosis.  Discuss your test results, treatment options, and if necessary, the need for more tests with your health care provider. Vaccines Your health care provider may recommend certain vaccines, such as:  Influenza vaccine. This is recommended every year.  Tetanus, diphtheria, and acellular pertussis (Tdap, Td) vaccine. You may need a Td booster every 10 years.  Varicella vaccine. You may need this if you have not been vaccinated.  Zoster vaccine. You may need this after age 5.  Measles, mumps, and rubella (MMR) vaccine. You may need at least one dose of MMR if you were born in  1957 or later. You may also need a second dose.  Pneumococcal 13-valent conjugate (PCV13) vaccine. You may need this if you have certain conditions and were not previously vaccinated.  Pneumococcal polysaccharide (PPSV23) vaccine. You may need one or two doses if you smoke cigarettes or if you have certain conditions.  Meningococcal vaccine. You may need this if you have certain  conditions.  Hepatitis A vaccine. You may need this if you have certain conditions or if you travel or work in places where you may be exposed to hepatitis A.  Hepatitis B vaccine. You may need this if you have certain conditions or if you travel or work in places where you may be exposed to hepatitis B.  Haemophilus influenzae type b (Hib) vaccine. You may need this if you have certain conditions.  Talk to your health care provider about which screenings and vaccines you need and how often you need them. This information is not intended to replace advice given to you by your health care provider. Make sure you discuss any questions you have with your health care provider. Document Released: 12/20/2015 Document Revised: 08/12/2016 Document Reviewed: 09/24/2015 Elsevier Interactive Patient Education  2018 [employer]  

## 2017-12-13 NOTE — Progress Notes (Signed)
Name: Christina Bradley   MRN: [id number]    DOB: 07/08/1961   Date:12/13/2017       Progress Note  Subjective  Chief Complaint  Chief Complaint  Patient presents with  . Annual Exam  . Depression    HPI   CPE:   She is feeling well, due for labs.   IBS/Ulcerative colitis : she has a history of ulcerative colitis, but when she was seen  by GI in 2016 - Dr. Fuller Plan  she was  advised to take something for anxiety to control her symptoms of recurrent abdominal  pain. She denies current  diarrhea or constipation, occasionally mucus on stools - usually when stressed , but no blood. .She has intermittent abdominal pain that is described as sharp, and usually on the left side, it can last up to all day sometimes, improves when she passes flatus, also when she has a bowel movement.  Symptoms seems to be aggravated by stress and not associated with  bladder issues, no previous history of kidney stones. We gave her Cymbalta 30 mg, December 2017 she only takes it prn, for about one week and never went up on the dose. She states she is feeling very well on medication. She states at most three episodes of abdominal pain in 2018, she is due for repeat colonoscopy   GAD: doing very well on Cymbalta, worries less often , coping better with medication and no side effects, she only takes it prn , but willing to take more often since her mind is busy at night.   Ulcerative colitis: no current symptoms. Sees GI  Recurrent sinusitis: she was doing well after sinusplasty in 2015 however over the past 6 months she noticed facial pressure and congestion again and would like to go back to Dr. Kathyrn Sheriff, she is on the second round of augmentin that she got filled from the local urgent care   USPSTF grade A and B recommendations  Depression:  Depression screen [facility] 2/9 12/13/2017 11/18/2016 11/29/2015  Decreased Interest 0 0 0  Down, Depressed, Hopeless 0 0 0  PHQ - 2 Score 0 0 0   Hypertension: BP Readings from Last 3  Encounters:  12/13/17 128/76  11/18/16 132/68  11/29/15 (!) 142/96   Obesity: Wt Readings from Last 3 Encounters:  11/18/16 148 lb (67.1 kg)  11/29/15 139 lb 4.8 oz (63.2 kg)  08/16/15 136 lb 8 oz (61.9 kg)   BMI Readings from Last 3 Encounters:  12/13/17 23.71 kg/m  11/18/16 23.18 kg/m  11/29/15 21.82 kg/m    Alcohol: very seldom  Tobacco use: negative  HIV, hep B, hep C: declined  STD testing and prevention (chl/gon/syphilis): not interested and not symptoms Intimate partner violence:negative screen.  Sexual History/Pain during Intercourse: no pain during intercourse, normal libido Menstrual History/LMP/Abnormal Bleeding: LMP at age 24 yo, no post-menopausal bleeding Incontinence Symptoms: negative screen    Advanced Care Planning: A voluntary discussion about advance care planning including the explanation and discussion of advance directives.  Discussed health care proxy and Living will, and the patient was able to identify a health care proxy as husband - Joe  Patient does not have a living will at present time. If patient does have living will, I have requested they bring this to the clinic to be scanned in to their chart.  Breast cancer:had at [facility] gyn in 2017 , she would like to go to [facility] now  BRCA gene screening: not a candidate Cervical cancer screening: due  for repeat, last one done through gyn in over one year   Fall prevention/vitamin D: discussed sources of vitamin D and calcium  Lipids:  No results found for: CHOL No results found for: HDL No results found for: LDLCALC No results found for: TRIG No results found for: CHOLHDL No results found for: LDLDIRECT  Glucose:  No results found for: GLUCOSE, GLUCAP  Skin cancer: no atypical moles Colorectal cancer: 57 years old. Lung cancer:   Low Dose CT Chest recommended if Age 4-80 years, 30 pack-year currently smoking OR have quit w/in 15years. Patient does not qualify.   Aspirin:  81 mg daily  discussed today based on USPTF QMG:QQPYPPJ today    Patient Active Problem List   Diagnosis Date Noted  . White coat hypertension 11/29/2015  . GAD (generalized anxiety disorder) 11/29/2015  . GERD without esophagitis 11/29/2015  . IBS (irritable bowel syndrome) 09/07/2008  . Ulcerative colitis ([place]) 09/06/2008    Past Surgical History:  Procedure Laterality Date  . NASAL ENDOSCOPY W/ BALLON SINUPLASTY    . NASAL SINUS SURGERY  2015    Family History  Problem Relation Age of Onset  . Hyperlipidemia Mother   . COPD Father   . Colon cancer Neg Hx     Social History   Socioeconomic History  . Marital status: Married    Spouse name: Joe  . Number of children: 2  . Years of education: Not on file  . Highest education level: Not on file  Social Needs  . Financial resource strain: Not hard at all  . Food insecurity - worry: Never true  . Food insecurity - inability: Never true  . Transportation needs - medical: No  . Transportation needs - non-medical: No  Occupational History  . Occupation: Data processing manager  Tobacco Use  . Smoking status: Never Smoker  . Smokeless tobacco: Never Used  Substance and Sexual Activity  . Alcohol use: Yes    Alcohol/week: 0.6 oz    Types: 1 Glasses of wine per week    Comment: occasionally   . Drug use: No  . Sexual activity: Yes    Partners: Male    Birth control/protection: Post-menopausal  Other Topics Concern  . Not on file  Social History Narrative   Works three 10 hours days at [employer] and twice a week she watches her grandchildren.      Current Outpatient Medications:  .  amoxicillin-clavulanate (AUGMENTIN) 875-125 MG tablet, Take by mouth., Disp: , Rfl:  .  DULoxetine (CYMBALTA) 30 MG capsule, Take 1 capsule (30 mg total) by mouth daily., Disp: 90 capsule, Rfl: 4  No Known Allergies   ROS   Constitutional: Negative for fever or weight change.  Respiratory: Negative for cough and shortness of breath.    Cardiovascular: Negative for chest pain or palpitations.  Gastrointestinal: Negative for abdominal pain, no bowel changes.  Musculoskeletal: Negative for gait problem or joint swelling.  Skin: Negative for rash.  Neurological: Negative for dizziness or headache.  No other specific complaints in a complete review of systems (except as listed in HPI above).   Objective  Vitals:   12/13/17 1326  BP: 128/76  Pulse: (!) 108  Resp: 16  Temp: 97.9 F (36.6 C)  TempSrc: Oral  SpO2: 97%  Height: 5' 6.25" (1.683 m)    Body mass index is 23.71 kg/m.  Physical Exam  Constitutional: Patient appears well-developed and well-nourished. No distress.  HENT: Head: Normocephalic and atraumatic. Ears: B TMs ok, no  erythema or effusion; Nose: Nose normal. Mouth/Throat: Oropharynx is clear and moist. No oropharyngeal exudate.  Eyes: Conjunctivae and EOM are normal. Pupils are equal, round, and reactive to light. No scleral icterus.  Neck: Normal range of motion. Neck supple. No JVD present. No thyromegaly present.  Cardiovascular: Normal rate, regular rhythm and normal heart sounds.  No murmur heard. No BLE edema. Pulmonary/Chest: Effort normal and breath sounds normal. No respiratory distress. Abdominal: Soft. Bowel sounds are normal, no distension. There is no tenderness. no masses Breast: no lumps or masses, no nipple discharge or rashes FEMALE GENITALIA:  External genitalia normal External urethra normal Vaginal vault normal without discharge or lesions Cervix normal without discharge or lesions Bimanual exam normal without masses RECTAL: not done Musculoskeletal: Normal range of motion, no joint effusions. No gross deformities Neurological: he is alert and oriented to person, place, and time. No cranial nerve deficit. Coordination, balance, strength, speech and gait are normal.  Skin: Skin is warm and dry. No rash noted. No erythema.  Psychiatric: Patient has a normal mood and affect.  behavior is normal. Judgment and thought content normal.     PHQ2/9: Depression screen [facility] 2/9 12/13/2017 11/18/2016 11/29/2015  Decreased Interest 0 0 0  Down, Depressed, Hopeless 0 0 0  PHQ - 2 Score 0 0 0     Fall Risk: Fall Risk  12/13/2017 12/13/2017 11/18/2016 11/29/2015  Falls in the past year? Yes No No No  Comment fell off a ladder June 2018  - - -  Number falls in past yr: 1 - - -  Injury with Fall? Yes - - -  Risk for fall due to : History of fall(s) - - -  Follow up Education provided - - -     Functional Status Survey: Is the patient deaf or have difficulty hearing?: No Does the patient have difficulty seeing, even when wearing glasses/contacts?: No Does the patient have difficulty concentrating, remembering, or making decisions?: No Does the patient have difficulty walking or climbing stairs?: No Does the patient have difficulty dressing or bathing?: No Does the patient have difficulty doing errands alone such as visiting a doctor's office or shopping?: No   Assessment & Plan  1. Well woman exam  Discussed importance of 150 minutes of physical activity weekly, eat two servings of fish weekly, eat one serving of tree nuts ( cashews, pistachios, pecans, almonds.Marland Kitchen) every other day, eat 6 servings of fruit/vegetables daily and drink plenty of water and avoid sweet beverages.  - COMPLETE METABOLIC PANEL WITH GFR - CBC with Differential/Platelet - Hemoglobin A1c - Lipid panel  2. Other ulcerative colitis without complication ([place])  - Ambulatory referral to Gastroenterology  3. Irritable bowel syndrome, unspecified type  - DULoxetine (CYMBALTA) 30 MG capsule; Take 1 capsule (30 mg total) by mouth daily.  Dispense: 90 capsule; Refill: 4  4. GAD (generalized anxiety disorder)  - DULoxetine (CYMBALTA) 30 MG capsule; Take 1 capsule (30 mg total) by mouth daily.  Dispense: 90 capsule; Refill: 4  5. Breast cancer screening  - MM DIGITAL SCREENING BILATERAL;  Future  6. Cervical cancer screening  - Pap IG and HPV (high risk) DNA detection  7. Recurrent sinusitis  - Ambulatory referral to ENT

## 2017-12-15 ENCOUNTER — Other Ambulatory Visit: Payer: Self-pay | Admitting: Family Medicine

## 2017-12-15 ENCOUNTER — Telehealth: Payer: Self-pay | Admitting: Emergency Medicine

## 2017-12-15 MED ORDER — PREDNISONE 10 MG PO TABS: 10.0000 mg | ORAL_TABLET | Freq: Every day | ORAL | 0 refills | Status: DC

## 2017-12-15 NOTE — Telephone Encounter (Signed)
Patient stated she is not feeling much better. She thought Prednisone would be called in for her. Would like this called in to pharmacy. Still have a cough

## 2017-12-15 NOTE — Progress Notes (Unsigned)
pre

## 2017-12-16 LAB — PAP IG AND HPV HIGH-RISK: HPV DNA High Risk: DETECTED — AB

## 2017-12-17 LAB — COMPLETE METABOLIC PANEL WITH GFR
AG Ratio: 1.8 (calc) (ref 1.0–2.5)
ALBUMIN MSPROF: 4.4 g/dL (ref 3.6–5.1)
ALKALINE PHOSPHATASE (APISO): 48 U/L (ref 33–130)
ALT: 17 U/L (ref 6–29)
AST: 21 U/L (ref 10–35)
BUN: 11 mg/dL (ref 7–25)
CO2: 31 mmol/L (ref 20–32)
Calcium: 9.2 mg/dL (ref 8.6–10.4)
Chloride: 103 mmol/L (ref 98–110)
Creat: 0.91 mg/dL (ref 0.50–1.05)
GFR, Est African American: 82 mL/min/{1.73_m2} (ref >=60)
GFR, Est Non African American: 71 mL/min/{1.73_m2} (ref >=60)
Globulin: 2.4 g/dL (calc) (ref 1.9–3.7)
Glucose, Bld: 105 mg/dL — ABNORMAL HIGH (ref 65–99)
Potassium: 4.3 mmol/L (ref 3.5–5.3)
Sodium: 141 mmol/L (ref 135–146)
Total Bilirubin: 0.3 mg/dL (ref 0.2–1.2)
Total Protein: 6.8 g/dL (ref 6.1–8.1)

## 2017-12-17 LAB — HEMOGLOBIN A1C
HEMOGLOBIN A1C: 6.1 %{Hb} — AB (ref <5.7)
Mean Plasma Glucose: 128 (calc)
eAG (mmol/L): 7.1 (calc)

## 2017-12-17 LAB — CBC WITH DIFFERENTIAL/PLATELET
BASOS PCT: 0.3 %
Basophils Absolute: 20 cells/uL (ref 0–200)
Eosinophils Absolute: 20 cells/uL (ref 15–500)
Eosinophils Relative: 0.3 %
HEMATOCRIT: 40.5 % (ref 35.0–45.0)
HEMOGLOBIN: 13.9 g/dL (ref 11.7–15.5)
LYMPHS ABS: 1521 {cells}/uL (ref 850–3900)
MCH: 31 pg (ref 27.0–33.0)
MCHC: 34.3 g/dL (ref 32.0–36.0)
MCV: 90.2 fL (ref 80.0–100.0)
MPV: 11.1 fL (ref 7.5–12.5)
Monocytes Relative: 15 %
Neutro Abs: 3965 cells/uL (ref 1500–7800)
Neutrophils Relative %: 61 %
Platelets: 234 10*3/uL (ref 140–400)
RBC: 4.49 10*6/uL (ref 3.80–5.10)
RDW: 12.5 % (ref 11.0–15.0)
Total Lymphocyte: 23.4 %
WBC: 6.5 10*3/uL (ref 3.8–10.8)
WBCMIX: 975 {cells}/uL — AB (ref 200–950)

## 2017-12-17 LAB — LIPID PANEL
Cholesterol: 208 mg/dL — ABNORMAL HIGH (ref <200)
HDL: 50 mg/dL — ABNORMAL LOW (ref >50)
LDL CHOLESTEROL (CALC): 140 mg/dL — AB
NON-HDL CHOLESTEROL (CALC): 158 mg/dL — AB (ref <130)
TRIGLYCERIDES: 82 mg/dL (ref <150)
Total CHOL/HDL Ratio: 4.2 (calc) (ref <5.0)

## 2017-12-24 LAB — HM MAMMOGRAPHY: HM Mammogram: NORMAL (ref 0–4)

## 2018-09-20 ENCOUNTER — Other Ambulatory Visit: Payer: Self-pay | Admitting: Gastroenterology

## 2018-09-20 DIAGNOSIS — R1314 Dysphagia, pharyngoesophageal phase: Secondary | ICD-10-CM

## 2018-09-26 ENCOUNTER — Ambulatory Visit
Admission: RE | Admit: 2018-09-26 | Discharge: 2018-09-26 | Disposition: A | Discharge status: Home / Self Care | Payer: 59 | Source: Ambulatory Visit | Attending: Gastroenterology | Admitting: Gastroenterology

## 2018-09-26 DIAGNOSIS — K219 Gastro-esophageal reflux disease without esophagitis: Secondary | ICD-10-CM | POA: Diagnosis not present

## 2018-09-26 DIAGNOSIS — R1314 Dysphagia, pharyngoesophageal phase: Secondary | ICD-10-CM

## 2018-12-16 ENCOUNTER — Other Ambulatory Visit: Payer: Self-pay | Admitting: Family Medicine

## 2018-12-16 DIAGNOSIS — K589 Irritable bowel syndrome without diarrhea: Secondary | ICD-10-CM

## 2018-12-16 DIAGNOSIS — F411 Generalized anxiety disorder: Secondary | ICD-10-CM

## 2018-12-16 NOTE — Telephone Encounter (Signed)
She needs follow up, can send 30days

## 2018-12-16 NOTE — Telephone Encounter (Signed)
Spoke with pt and she scheduled an appt for 2.27.19 (your 1st availablility)

## 2019-02-02 ENCOUNTER — Ambulatory Visit (INDEPENDENT_AMBULATORY_CARE_PROVIDER_SITE_OTHER): Payer: Managed Care, Other (non HMO) | Admitting: Family Medicine

## 2019-02-02 ENCOUNTER — Encounter: Payer: Self-pay | Admitting: Family Medicine

## 2019-02-02 ENCOUNTER — Other Ambulatory Visit (HOSPITAL_COMMUNITY)
Admission: RE | Admit: 2019-02-02 | Discharge: 2019-02-02 | Disposition: A | Discharge status: Home / Self Care | Payer: Managed Care, Other (non HMO) | Source: Ambulatory Visit | Attending: Family Medicine | Admitting: Family Medicine

## 2019-02-02 VITALS — BP 120/80 | HR 96 | Temp 98.8°F | Resp 16 | Ht 66.0 in | Wt 155.0 lb

## 2019-02-02 DIAGNOSIS — Z01419 Encounter for gynecological examination (general) (routine) without abnormal findings: Secondary | ICD-10-CM

## 2019-02-02 DIAGNOSIS — R7303 Prediabetes: Secondary | ICD-10-CM

## 2019-02-02 DIAGNOSIS — F411 Generalized anxiety disorder: Secondary | ICD-10-CM

## 2019-02-02 DIAGNOSIS — Z1239 Encounter for other screening for malignant neoplasm of breast: Secondary | ICD-10-CM

## 2019-02-02 DIAGNOSIS — E7800 Pure hypercholesterolemia, unspecified: Secondary | ICD-10-CM

## 2019-02-02 DIAGNOSIS — K589 Irritable bowel syndrome without diarrhea: Secondary | ICD-10-CM | POA: Diagnosis not present

## 2019-02-02 DIAGNOSIS — Z124 Encounter for screening for malignant neoplasm of cervix: Secondary | ICD-10-CM

## 2019-02-02 DIAGNOSIS — K5180 Other ulcerative colitis without complications: Secondary | ICD-10-CM

## 2019-02-02 DIAGNOSIS — R87810 Cervical high risk human papillomavirus (HPV) DNA test positive: Secondary | ICD-10-CM | POA: Diagnosis present

## 2019-02-02 MED ORDER — DULOXETINE HCL 60 MG PO CPEP: 60.0000 mg | ORAL_CAPSULE | Freq: Every day | ORAL | 3 refills | Status: DC

## 2019-02-02 NOTE — Progress Notes (Signed)
Name: Christina Bradley   MRN: [id number]    DOB: 07/10/1961   Date:02/02/2019       Progress Note  Subjective  Chief Complaint  Chief Complaint  Patient presents with  . Annual Exam  . Medication Refill  . GAD    HPI   Patient presents for annual CPE and follow up  Ulcerative Colitis: positive on colonoscopy done in 2004, repeat in 2013 was negative and due for repeat in 2023. She denies any current symptoms. No abdominal pain, fever , blood or mucus in stools. There is also a history of IBS but she states symptoms resolved with duloxetine   GAD: doing well on duloxetine , she states she has been taking two 30 mg capsules daily, we will change to 60 mg, phq 9 negative   Pre-diabetes: discussed importance of following a diabetic diet. She denies polyphagia, polyuria or polydipsia   Diet: trying to eat healthier  Exercise: not currently   USPSTF grade A and B recommendations    [facility] Visit from [facility]/02/2019 in [facility]  AUDIT-C Score  1     Depression:  Depression screen [facility] 2/9 02/02/2019 12/13/2017 11/18/2016 11/29/2015  Decreased Interest 0 0 0 0  Down, Depressed, Hopeless 0 0 0 0  PHQ - 2 Score 0 0 0 0  Altered sleeping 1 - - -  Tired, decreased energy 1 - - -  Change in appetite 0 - - -  Feeling bad or failure about yourself  0 - - -  Trouble concentrating 0 - - -  Moving slowly or fidgety/restless 0 - - -  Suicidal thoughts 0 - - -  PHQ-9 Score 2 - - -  Difficult doing work/chores Not difficult at all - - -   Hypertension: BP Readings from Last 3 Encounters:  02/02/19 120/80  12/13/17 128/76  11/18/16 132/68   Obesity: Wt Readings from Last 3 Encounters:  02/02/19 155 lb (70.3 kg)  12/13/17 156 lb 9.6 oz (71 kg)  11/18/16 148 lb (67.1 kg)   BMI Readings from Last 3 Encounters:  02/02/19 25.02 kg/m  12/13/17 25.09 kg/m  11/18/16 23.18 kg/m    Hep C Screening: up to date  STD testing and prevention (HIV/chl/gon/syphilis):  N/A Intimate partner violence: negative screen  Sexual History/Pain during Intercourse: no pain  Menstrual History/LMP/Abnormal Bleeding: post-menopausal  Incontinence Symptoms: no  Advanced Care Planning: A voluntary discussion about advance care planning including the explanation and discussion of advance directives.  Discussed health care proxy and Living will, and the patient was able to identify a health care proxy as husband - Joe .  Patient does not have a living will at present time. If patient does have living will, I have requested they bring this to the clinic to be scanned in to their chart.  Breast cancer:  [facility] Mammogram  Date Value Ref Range Status  12/24/2017 Self Reported Normal 0-4 Bi-Rad, Self Reported Normal Final    BRCA gene screening: N/A Cervical cancer screening: pap smear today because of positive HPV   Osteoporosis Screening: discussed high calcium diet   Lipids:  Lab Results  Component Value Date   CHOL 208 (H) 12/16/2017   Lab Results  Component Value Date   HDL 50 (L) 12/16/2017   Lab Results  Component Value Date   LDLCALC 140 (H) 12/16/2017   Lab Results  Component Value Date   TRIG 82 12/16/2017   Lab Results  Component Value Date  CHOLHDL 4.2 12/16/2017   No results found for: LDLDIRECT  Glucose:  Glucose, Bld  Date Value Ref Range Status  12/16/2017 105 (H) 65 - 99 mg/dL Final    Comment:    .            Fasting reference interval . For someone without known diabetes, a glucose value between 100 and 125 mg/dL is consistent with prediabetes and should be confirmed with a follow-up test. .     Skin cancer: discussed atypical lesions  Colorectal cancer: up to date repeat in 2023 Lung cancer:   Low Dose CT Chest recommended if Age 30-80 years, 30 pack-year currently smoking OR have quit w/in 15years. Patient does not qualify.   ECG: she will check with insurance   Patient Active Problem List   Diagnosis Date Noted  . White  coat hypertension 11/29/2015  . GAD (generalized anxiety disorder) 11/29/2015  . GERD without esophagitis 11/29/2015  . IBS (irritable bowel syndrome) 09/07/2008  . Ulcerative colitis ([place]) 09/06/2008    Past Surgical History:  Procedure Laterality Date  . NASAL ENDOSCOPY W/ BALLON SINUPLASTY    . NASAL SINUS SURGERY  2015    Family History  Problem Relation Age of Onset  . Hyperlipidemia Mother   . COPD Father   . Allergies Son   . Colon cancer Neg Hx     Social History   Socioeconomic History  . Marital status: Married    Spouse name: Joe  . Number of children: 2  . Years of education: Not on file  . Highest education level: Not on file  Occupational History  . 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 Financial resource strain: Not hard at all  . Food insecurity:    Worry: Never true    Inability: Never true  . Transportation needs:    Medical: No    Non-medical: No  Tobacco Use  . Smoking status: Never Smoker  . Smokeless tobacco: Never Used  Substance and Sexual Activity  . Alcohol use: Yes    Alcohol/week: 1.0 standard drinks    Types: 1 Glasses of wine per week    Comment: occasionally   . Drug use: No  . Sexual activity: Yes    Partners: Male    Birth control/protection: Post-menopausal  Lifestyle  . Physical activity:    Days per week: 0 days    Minutes per session: 0 min  . Stress: Not at all  Relationships  . Social connections:    Talks on phone: More than three times a week    Gets together: More than three times a week    Attends religious service: More than 4 times per year    Active member of club or organization: No    Attends meetings of clubs or organizations: Never    Relationship status: Married  . Intimate partner violence:    Fear of current or ex partner: No    Emotionally abused: No    Physically abused: No    Forced sexual activity: No  Other Topics Concern  . Not on file  Social History  Narrative   Works three 10 hours days at [employer] and twice a week she watches her grandchildren.      Current Outpatient Medications:  .  RABEprazole (ACIPHEX) 20 MG tablet, Take 20 mg by mouth daily., Disp: , Rfl:  .  DULoxetine (CYMBALTA) 60 MG capsule, Take 1 capsule (  60 mg total) by mouth daily., Disp: 90 capsule, Rfl: 3  No Known Allergies   ROS  Constitutional: Negative for fever or weight change.  Respiratory: Negative for cough and shortness of breath.   Cardiovascular: Negative for chest pain or palpitations.  Gastrointestinal: Negative for abdominal pain, no bowel changes.  Musculoskeletal: Negative for gait problem or joint swelling.  Skin: Negative for rash.  Neurological: Negative for dizziness or headache.  No other specific complaints in a complete review of systems (except as listed in HPI above).  Objective  Vitals:   02/02/19 1008  BP: 120/80  Pulse: 96  Resp: 16  Temp: 98.8 F (37.1 C)  TempSrc: Oral  SpO2: 96%  Weight: 155 lb (70.3 kg)  Height: 5' 6"  (1.676 m)    Body mass index is 25.02 kg/m.  Physical Exam  Constitutional: Patient appears well-developed and well-nourished. No distress.  HENT: Head: Normocephalic and atraumatic. Ears: B TMs ok, no erythema or effusion; Nose: Nose normal. Mouth/Throat: Oropharynx is clear and moist. No oropharyngeal exudate.  Eyes: Conjunctivae and EOM are normal. Pupils are equal, round, and reactive to light. No scleral icterus.  Neck: Normal range of motion. Neck supple. No JVD present. No thyromegaly present.  Cardiovascular: Normal rate, regular rhythm and normal heart sounds.  No murmur heard. No BLE edema. Pulmonary/Chest: Effort normal and breath sounds normal. No respiratory distress. Abdominal: Soft. Bowel sounds are normal, no distension. There is no tenderness. no masses Breast: no lumps or masses, no nipple discharge or rashes FEMALE GENITALIA:  External genitalia normal External urethra  normal Vaginal vault normal without discharge or lesions Cervix normal without discharge or lesions Bimanual exam normal without masses RECTAL: not done  Musculoskeletal: Normal range of motion, no joint effusions. No gross deformities Neurological: he is alert and oriented to person, place, and time. No cranial nerve deficit. Coordination, balance, strength, speech and gait are normal.  Skin: Skin is warm and dry. No rash noted. No erythema.  Psychiatric: Patient has a normal mood and affect. behavior is normal. Judgment and thought content normal.  PHQ2/9: Depression screen [facility] 2/9 02/02/2019 12/13/2017 11/18/2016 11/29/2015  Decreased Interest 0 0 0 0  Down, Depressed, Hopeless 0 0 0 0  PHQ - 2 Score 0 0 0 0  Altered sleeping 1 - - -  Tired, decreased energy 1 - - -  Change in appetite 0 - - -  Feeling bad or failure about yourself  0 - - -  Trouble concentrating 0 - - -  Moving slowly or fidgety/restless 0 - - -  Suicidal thoughts 0 - - -  PHQ-9 Score 2 - - -  Difficult doing work/chores Not difficult at all - - -     Fall Risk: Fall Risk  02/02/2019 12/13/2017 12/13/2017 11/18/2016 11/29/2015  Falls in the past year? 0 Yes No No No  Comment - fell off a ladder June 2018  - - -  Number falls in past yr: 0 1 - - -  Injury with Fall? 0 Yes - - -  Risk for fall due to : - History of fall(s) - - -  Follow up Falls evaluation completed Education provided - - -     Functional Status Survey: Is the patient deaf or have difficulty hearing?: No Does the patient have difficulty seeing, even when wearing glasses/contacts?: Yes Does the patient have difficulty concentrating, remembering, or making decisions?: No Does the patient have difficulty walking or climbing stairs?: No Does the  patient have difficulty dressing or bathing?: No Does the patient have difficulty doing errands alone such as visiting a doctor's office or shopping?: No   Assessment & Plan  1. GAD (generalized anxiety  disorder)  - DULoxetine (CYMBALTA) 60 MG capsule; Take 1 capsule (60 mg total) by mouth daily.  Dispense: 90 capsule; Refill: 3  2. Well woman exam  - COMPLETE METABOLIC PANEL WITH GFR - CBC with Differential/Platelet - Lipid panel - Hemoglobin A1c - Cytology - PAP  3. Irritable bowel syndrome, unspecified type  - DULoxetine (CYMBALTA) 60 MG capsule; Take 1 capsule (60 mg total) by mouth daily.  Dispense: 90 capsule; Refill: 3  4. Other ulcerative colitis without complication (H[facility])  - CBC with Differential/Platelet  5. Cervical cancer screening  - Cytology - PAP  6. Cervical high risk HPV (human papillomavirus) test positive  - Cytology - PAP  7. Breast cancer screening  At [facility] per patient request   8. Pre-diabetes  - Hemoglobin A1c  9. Pure hypercholesterolemia  - Lipid panel   -USPSTF grade A and B recommendations reviewed with patient; age-appropriate recommendations, preventive care, screening tests, etc discussed and encouraged; healthy living encouraged; see AVS for patient education given to patient -Discussed importance of 150 minutes of physical activity weekly, eat two servings of fish weekly, eat one serving of tree nuts ( cashews, pistachios, pecans, almonds.Marland Kitchen) every other day, eat 6 servings of fruit/vegetables daily and drink plenty of water and avoid sweet beverages.

## 2019-02-02 NOTE — Patient Instructions (Signed)
Preventive Care 40-64 Years, Female Preventive care refers to lifestyle choices and visits with your health care provider that can promote health and wellness. What does preventive care include?   A yearly physical exam. This is also called an annual well check.  Dental exams once or twice a year.  Routine eye exams. Ask your health care provider how often you should have your eyes checked.  Personal lifestyle choices, including: ? Daily care of your teeth and gums. ? Regular physical activity. ? Eating a healthy diet. ? Avoiding tobacco and drug use. ? Limiting alcohol use. ? Practicing safe sex. ? Taking low-dose aspirin daily starting at age 50. ? Taking vitamin and mineral supplements as recommended by your health care provider. What happens during an annual well check? The services and screenings done by your health care provider during your annual well check will depend on your age, overall health, lifestyle risk factors, and family history of disease. Counseling Your health care provider may ask you questions about your:  Alcohol use.  Tobacco use.  Drug use.  Emotional well-being.  Home and relationship well-being.  Sexual activity.  Eating habits.  Work and work environment.  Method of birth control.  Menstrual cycle.  Pregnancy history. Screening You may have the following tests or measurements:  Height, weight, and BMI.  Blood pressure.  Lipid and cholesterol levels. These may be checked every 5 years, or more frequently if you are over 50 years old.  Skin check.  Lung cancer screening. You may have this screening every year starting at age 55 if you have a 30-pack-year history of smoking and currently smoke or have quit within the past 15 years.  Colorectal cancer screening. All adults should have this screening starting at age 50 and continuing until age 75. Your health care provider may recommend screening at age 45. You will have tests every  1-10 years, depending on your results and the type of screening test. People at increased risk should start screening at an earlier age. Screening tests may include: ? Guaiac-based fecal occult blood testing. ? Fecal immunochemical test (FIT). ? Stool DNA test. ? Virtual colonoscopy. ? Sigmoidoscopy. During this test, a flexible tube with a tiny camera (sigmoidoscope) is used to examine your rectum and lower colon. The sigmoidoscope is inserted through your anus into your rectum and lower colon. ? Colonoscopy. During this test, a long, thin, flexible tube with a tiny camera (colonoscope) is used to examine your entire colon and rectum.  Hepatitis C blood test.  Hepatitis B blood test.  Sexually transmitted disease (STD) testing.  Diabetes screening. This is done by checking your blood sugar (glucose) after you have not eaten for a while (fasting). You may have this done every 1-3 years.  Mammogram. This may be done every 1-2 years. Talk to your health care provider about when you should start having regular mammograms. This may depend on whether you have a family history of breast cancer.  BRCA-related cancer screening. This may be done if you have a family history of breast, ovarian, tubal, or peritoneal cancers.  Pelvic exam and Pap test. This may be done every 3 years starting at age 21. Starting at age 30, this may be done every 5 years if you have a Pap test in combination with an HPV test.  Bone density scan. This is done to screen for osteoporosis. You may have this scan if you are at high risk for osteoporosis. Discuss your test results, treatment options,   and if necessary, the need for more tests with your health care provider. Vaccines Your health care provider may recommend certain vaccines, such as:  Influenza vaccine. This is recommended every year.  Tetanus, diphtheria, and acellular pertussis (Tdap, Td) vaccine. You may need a Td booster every 10 years.  Varicella  vaccine. You may need this if you have not been vaccinated.  Zoster vaccine. You may need this after age 38.  Measles, mumps, and rubella (MMR) vaccine. You may need at least one dose of MMR if you were born in 1957 or later. You may also need a second dose.  Pneumococcal 13-valent conjugate (PCV13) vaccine. You may need this if you have certain conditions and were not previously vaccinated.  Pneumococcal polysaccharide (PPSV23) vaccine. You may need one or two doses if you smoke cigarettes or if you have certain conditions.  Meningococcal vaccine. You may need this if you have certain conditions.  Hepatitis A vaccine. You may need this if you have certain conditions or if you travel or work in places where you may be exposed to hepatitis A.  Hepatitis B vaccine. You may need this if you have certain conditions or if you travel or work in places where you may be exposed to hepatitis B.  Haemophilus influenzae type b (Hib) vaccine. You may need this if you have certain conditions. Talk to your health care provider about which screenings and vaccines you need and how often you need them. This information is not intended to replace advice given to you by your health care provider. Make sure you discuss any questions you have with your health care provider. Document Released: 12/20/2015 Document Revised: 01/13/2018 Document Reviewed: 09/24/2015 Elsevier Interactive Patient Education  2019 [employer].

## 2019-02-03 LAB — COMPLETE METABOLIC PANEL WITH GFR
AG Ratio: 1.5 (calc) (ref 1.0–2.5)
ALT: 13 U/L (ref 6–29)
AST: 16 U/L (ref 10–35)
Albumin: 4.3 g/dL (ref 3.6–5.1)
Alkaline phosphatase (APISO): 49 U/L (ref 37–153)
BUN: 12 mg/dL (ref 7–25)
CO2: 31 mmol/L (ref 20–32)
Calcium: 9.8 mg/dL (ref 8.6–10.4)
Chloride: 102 mmol/L (ref 98–110)
Creat: 1.03 mg/dL (ref 0.50–1.05)
GFR, Est African American: 70 mL/min/{1.73_m2} (ref >=60)
GFR, Est Non African American: 61 mL/min/{1.73_m2} (ref >=60)
Globulin: 2.8 g/dL (calc) (ref 1.9–3.7)
Glucose, Bld: 82 mg/dL (ref 65–99)
POTASSIUM: 4.9 mmol/L (ref 3.5–5.3)
Sodium: 141 mmol/L (ref 135–146)
Total Bilirubin: 0.3 mg/dL (ref 0.2–1.2)
Total Protein: 7.1 g/dL (ref 6.1–8.1)

## 2019-02-03 LAB — CBC WITH DIFFERENTIAL/PLATELET
Absolute Monocytes: 714 cells/uL (ref 200–950)
Basophils Absolute: 52 cells/uL (ref 0–200)
Basophils Relative: 0.6 %
EOS ABS: 241 {cells}/uL (ref 15–500)
Eosinophils Relative: 2.8 %
HCT: 43.8 % (ref 35.0–45.0)
Hemoglobin: 14.8 g/dL (ref 11.7–15.5)
Lymphs Abs: 2623 cells/uL (ref 850–3900)
MCH: 30.5 pg (ref 27.0–33.0)
MCHC: 33.8 g/dL (ref 32.0–36.0)
MCV: 90.3 fL (ref 80.0–100.0)
MPV: 10.7 fL (ref 7.5–12.5)
Monocytes Relative: 8.3 %
NEUTROS PCT: 57.8 %
Neutro Abs: 4971 cells/uL (ref 1500–7800)
Platelets: 283 10*3/uL (ref 140–400)
RBC: 4.85 10*6/uL (ref 3.80–5.10)
RDW: 12.4 % (ref 11.0–15.0)
Total Lymphocyte: 30.5 %
WBC: 8.6 10*3/uL (ref 3.8–10.8)

## 2019-02-03 LAB — LIPID PANEL
Cholesterol: 246 mg/dL — ABNORMAL HIGH (ref <200)
HDL: 43 mg/dL — ABNORMAL LOW (ref >=50)
LDL Cholesterol (Calc): 166 mg/dL (calc) — ABNORMAL HIGH
Non-HDL Cholesterol (Calc): 203 mg/dL (calc) — ABNORMAL HIGH (ref <130)
Total CHOL/HDL Ratio: 5.7 (calc) — ABNORMAL HIGH (ref <5.0)
Triglycerides: 209 mg/dL — ABNORMAL HIGH (ref <150)

## 2019-02-03 LAB — HEMOGLOBIN A1C
Hgb A1c MFr Bld: 6.3 % of total Hgb — ABNORMAL HIGH (ref <5.7)
Mean Plasma Glucose: 134 (calc)
eAG (mmol/L): 7.4 (calc)

## 2019-02-06 LAB — CYTOLOGY - PAP
Diagnosis: NEGATIVE
HPV: DETECTED — AB

## 2019-02-10 LAB — HM MAMMOGRAPHY: HM Mammogram: NORMAL (ref 0–4)

## 2020-01-19 ENCOUNTER — Encounter: Payer: Self-pay | Admitting: Family Medicine

## 2020-01-19 ENCOUNTER — Telehealth: Payer: Self-pay | Admitting: Emergency Medicine

## 2020-01-19 NOTE — Telephone Encounter (Signed)
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

## 2020-01-19 NOTE — Telephone Encounter (Signed)
Copied from [place] [id number]. Topic: General - Inquiry >> Jan 19, 2020  8:45 AM Virl Axe D wrote: Reason for CRM: Pt stated she is going to take another 4 weeks of FMLA  and return to work on 02/26/20. She only needs Dr. Ancil Boozer to write a letter for [facility] that she is going to do this. No extra paperwork is needed. Please follow up with pt for pickup/questions.

## 2020-04-27 IMAGING — RF DG ESOPHAGUS
10 of 14 series · 14 of 22 positions shown · non-contrast
Comparison: None.

CLINICAL DATA: Pt states when eating or drinking she has a feeling
of something stuck in her upper esophagus. Pt states recently the
feeling also happens without having anything to eat or drink. Pt has
been taking acid reflux medications x 1 wk and feels a little
better.

EXAM:
ESOPHOGRAM / BARIUM SWALLOW / BARIUM TABLET STUDY
TECHNIQUE: Combined double contrast and single contrast examination performed
using effervescent crystals, thick barium liquid, and thin barium
liquid. The patient was observed with fluoroscopy swallowing a 13 mm
barium sulphate tablet.
FLUOROSCOPY TIME:  Fluoroscopy Time:  36 seconds
Radiation Exposure Index (if provided by the fluoroscopic device):
1.8 mGy
Number of Acquired Spot Images: 0

[Series 1: cp_standard · 0.51mm/px · 2 of 39 frames shown (1 of 10)]
[frame 6/39]
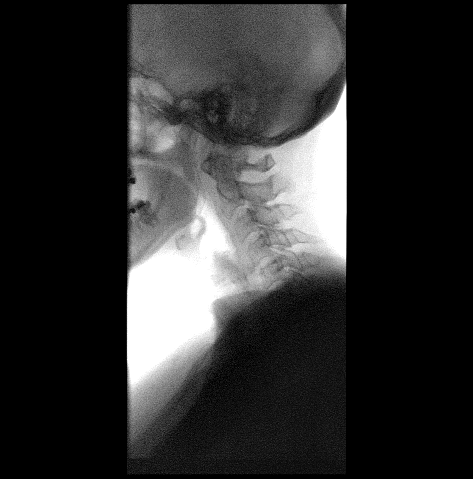
[frame 34/39]
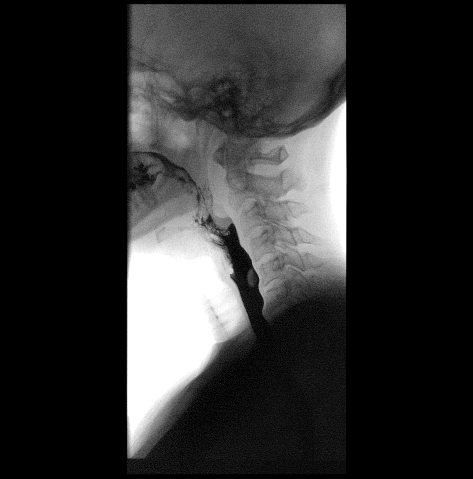

[Series 2: cp_standard · 0.51mm/px · 2 of 37 frames shown (2 of 10)]
[frame 6/37]
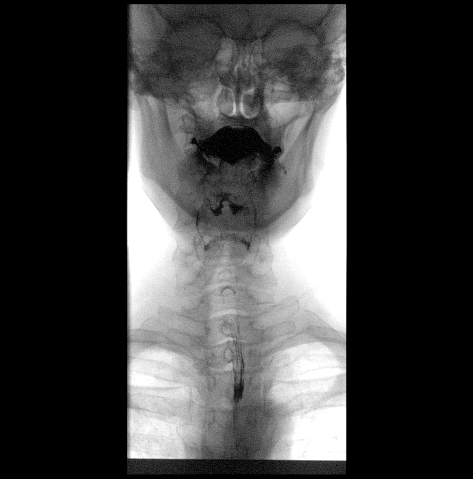
[frame 21/37]
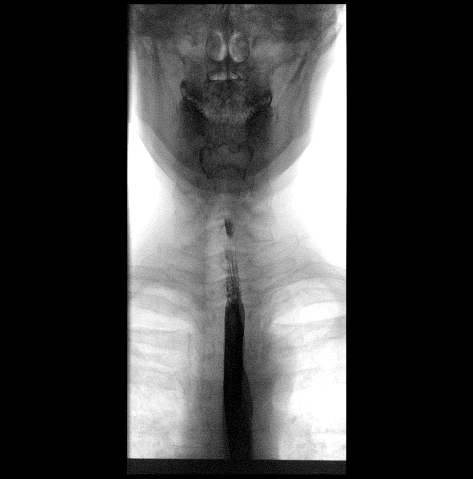

[Series 3: cp_standard · 0.52mm/px · 3 of 61 frames shown (3 of 10)]
[frame 7/61]
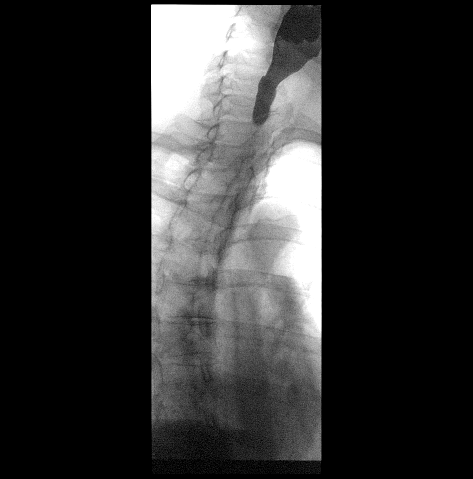
[frame 10/61]
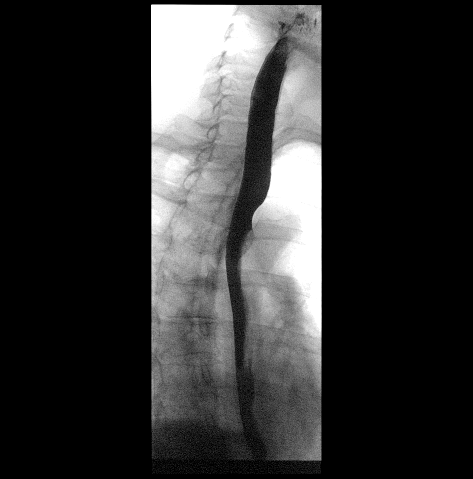
[frame 52/61]
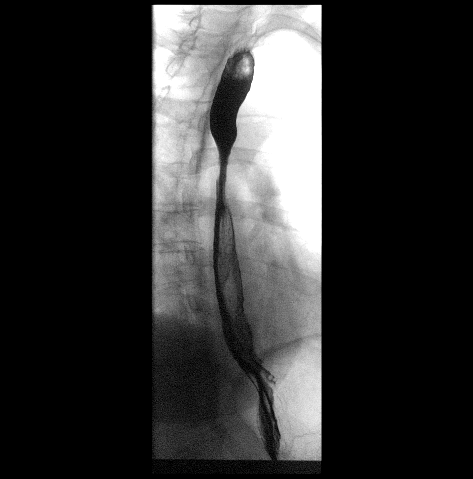

[Series 4: cp_standard · 0.26mm/px · 1 of 1 slices shown (4 of 10)]
[im 1/1]
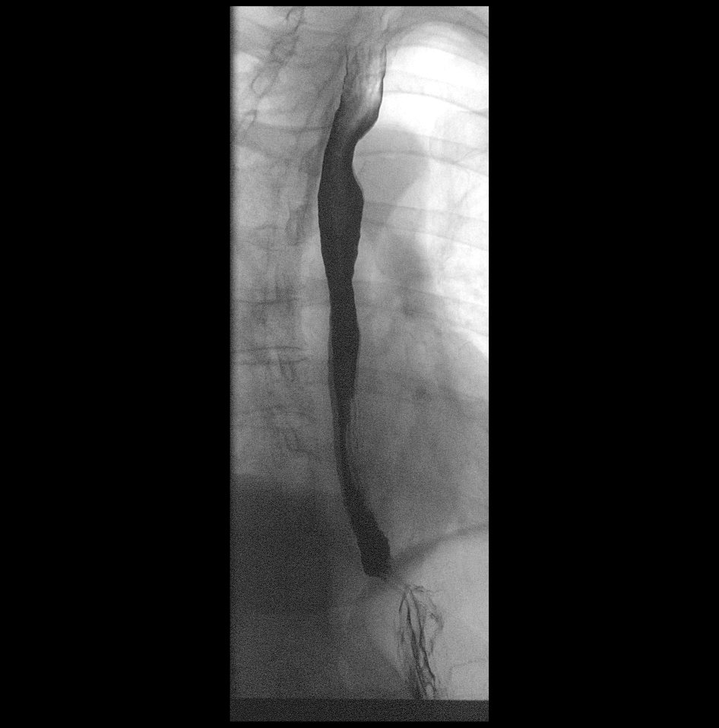

[Series 6: cp_standard · 0.26mm/px · 1 of 1 slices shown (5 of 10)]
[im 1/1]
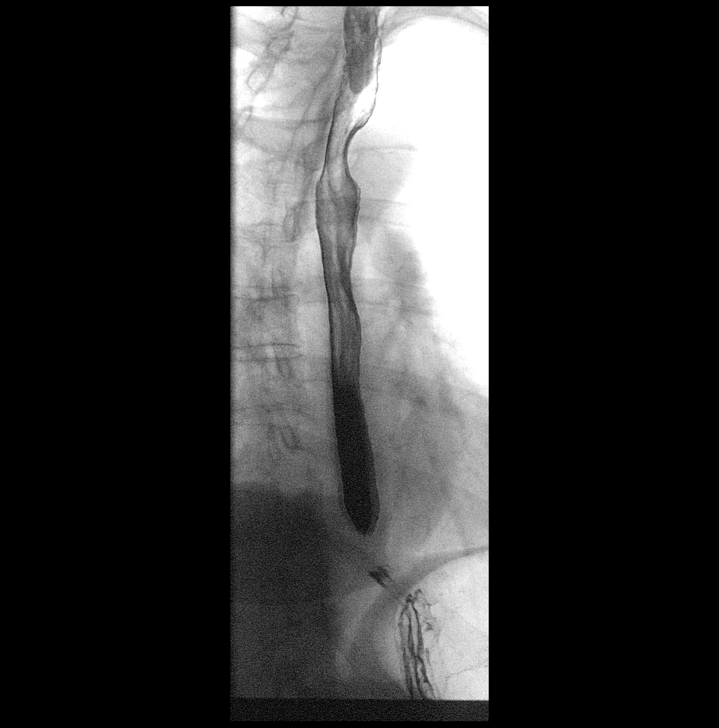

[Series 7: cp_standard · 0.26mm/px · 1 of 1 slices shown (6 of 10)]
[im 1/1]
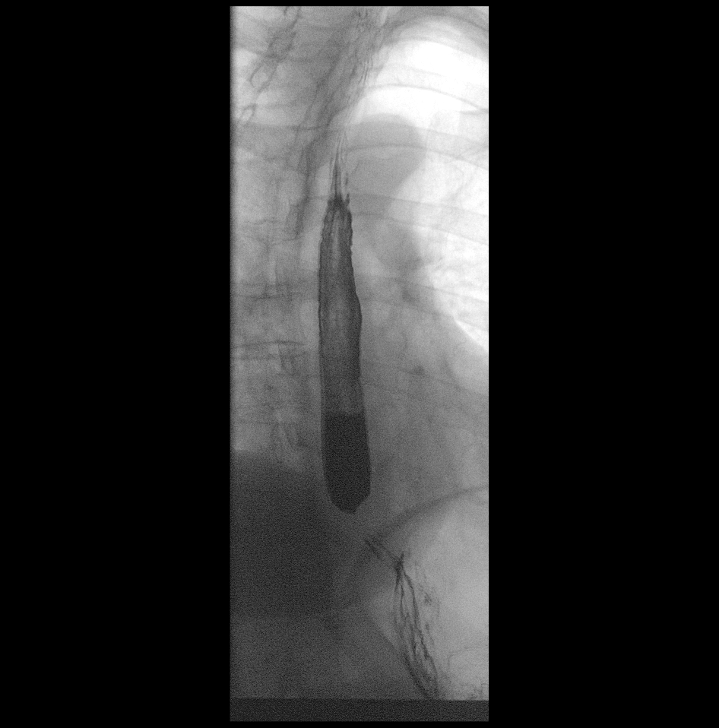

[Series 9: cp_standard · 0.27mm/px · 1 of 1 slices shown (7 of 10)]
[im 1/1]
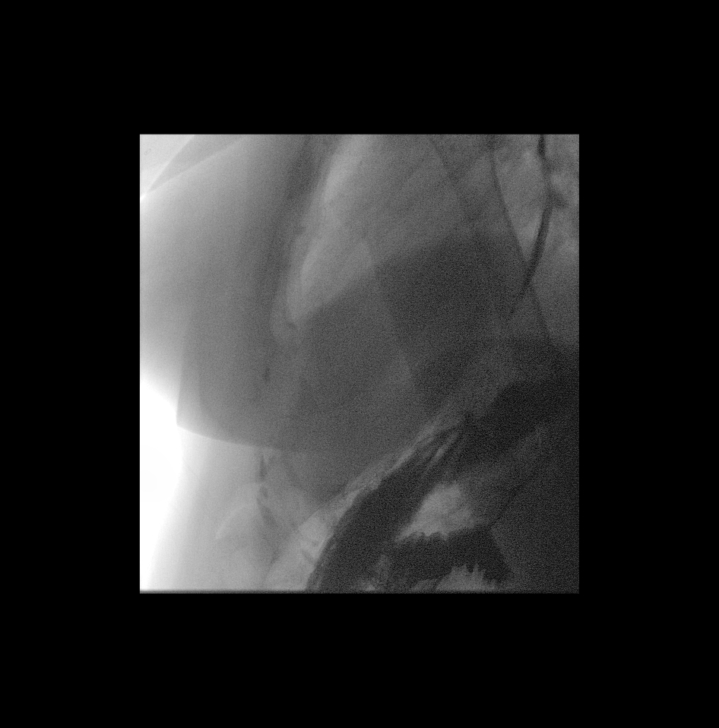

[Series 11: cp_standard · 0.28mm/px · 1 of 1 slices shown (8 of 10)]
[im 1/1]
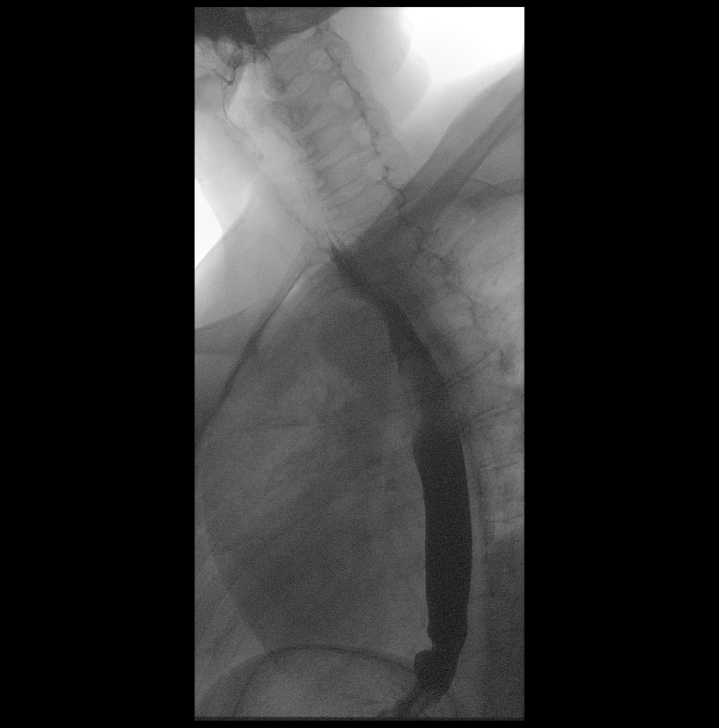

[Series 12: cp_standard · 0.28mm/px · 1 of 1 slices shown (9 of 10)]
[im 1/1]
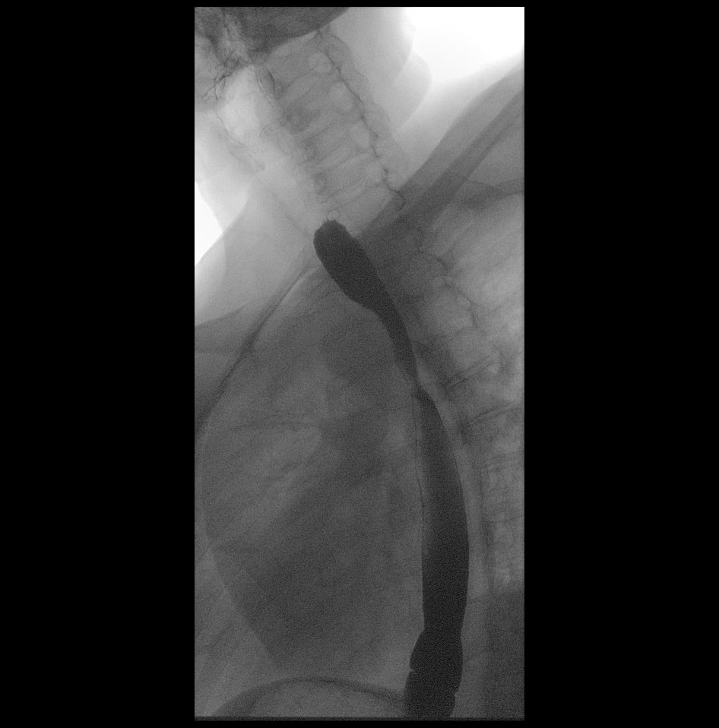

[Series 14: cp_standard · 0.28mm/px · 1 of 1 slices shown (10 of 10)]
[im 1/1]
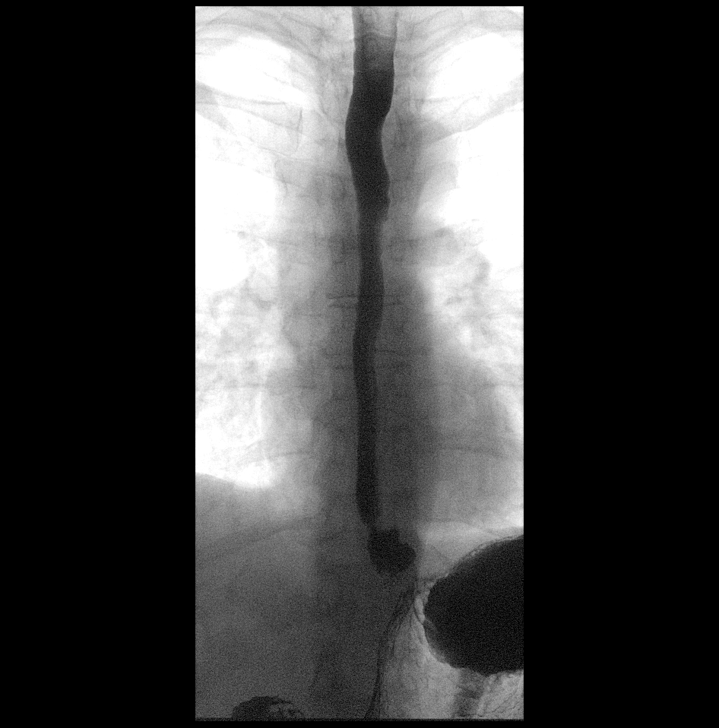

[14 of 22 positions shown; findings below may reference images not displayed]

FINDINGS: There was normal pharyngeal anatomy and motility. Contrast flowed
freely through the esophagus without evidence of stricture or mass.
There was normal esophageal mucosa without evidence of irregularity
or ulceration. Esophageal motility was normal. Mild gastroesophageal
reflux. Small transient hiatal hernia.

At the end of the examination a 13 mm barium tablet was administered
which transited through the esophagus and esophagogastric junction
without delay.
IMPRESSION: 1. Mild gastroesophageal reflux.
2. No esophageal stricture.

## 2020-07-16 ENCOUNTER — Telehealth: Payer: Self-pay | Admitting: Emergency Medicine

## 2020-07-16 NOTE — Telephone Encounter (Signed)
Patient called and stated she would like to discuss with you about the Covid vaccine. [place] is making it mandatory for them to get but patient stated she do not want to get at this time. 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 She has a history of vertigo, sinus issues and allergies and has heard the vaccination will elevated the symptoms. To opt out of vaccine need PCP letter or waiver

## 2020-07-18 NOTE — Telephone Encounter (Signed)
Patient notified

## 2020-08-26 ENCOUNTER — Telehealth: Payer: Self-pay | Admitting: Gastroenterology

## 2020-08-29 ENCOUNTER — Encounter: Payer: Self-pay | Admitting: Gastroenterology

## 2020-08-29 NOTE — Telephone Encounter (Signed)
Dr. Fuller Plan reviewed records and okay to schedule OV.

## 2020-10-25 ENCOUNTER — Encounter: Payer: Self-pay | Admitting: Gastroenterology

## 2020-10-25 ENCOUNTER — Ambulatory Visit: Payer: Managed Care, Other (non HMO) | Admitting: Gastroenterology

## 2020-10-25 VITALS — BP 124/72 | HR 110 | Ht 66.0 in | Wt 148.0 lb

## 2020-10-25 DIAGNOSIS — K588 Other irritable bowel syndrome: Secondary | ICD-10-CM | POA: Diagnosis not present

## 2020-10-25 DIAGNOSIS — K219 Gastro-esophageal reflux disease without esophagitis: Secondary | ICD-10-CM | POA: Diagnosis not present

## 2020-10-25 MED ORDER — RABEPRAZOLE SODIUM 20 MG PO TBEC
20.0000 mg | DELAYED_RELEASE_TABLET | Freq: Every day | ORAL | 11 refills | Status: DC
Start: 2020-10-25 — End: 2022-07-13

## 2020-10-25 MED ORDER — LEVOFLOXACIN 500 MG PO TABS: 500.0000 mg | ORAL_TABLET | Freq: Every day | ORAL | 0 refills | Status: AC

## 2020-10-25 MED ORDER — GLYCOPYRROLATE 1 MG PO TABS: 1.0000 mg | ORAL_TABLET | Freq: Two times a day (BID) | ORAL | 3 refills | Status: DC | PRN

## 2020-10-25 NOTE — Progress Notes (Addendum)
History of Present Illness: This is a 59 year old female referred by Steele Sizer, MD for the evaluation of LLQ pain, change in bowel habits.  She has a history of IBS and has been followed at [facility].  She was previously followed at [facility] and changed to [facility] in 2016.  She now requests to return to the [facility] for ongoing care.  She underwent colonoscopy here in December 2013 which was normal with normal biopsies. There is a questionable history of ulcerative proctitis in the distal past however she did not have proctitis on her last colonoscopy.  She been treated with Robinul and Librax in the past.  She is done very well over the past several years with only occasional episodes of left lower quadrant pain, mucus per rectum and increased bowel movements.  She had a similar episode in September.  She clearly notes that the symptoms occur when she is under more stress.  She has occasional reflux symptoms controlled on AcipHex as needed.  No other digestive complaints.  Barium esophagram performed in October 2019 showed mild esophageal reflux without strictures.  Fecal calprotectin in September 2021 was 339 with upper range of normal at 120.  She relates her sister's husband had a stroke about 1 year ago and has some residual left arm weakness.  She relates being under more stress since his stroke.  Denies weight loss,  constipation, diarrhea, change in stool caliber, melena, hematochezia, nausea, vomiting, dysphagia, chest pain.    No Known Allergies Outpatient Medications Prior to Visit  Medication Sig Dispense Refill  . DULoxetine (CYMBALTA) 60 MG capsule Take 1 capsule (60 mg total) by mouth daily. 90 capsule 3  . RABEprazole (ACIPHEX) 20 MG tablet Take 20 mg by mouth daily.     No facility-administered medications prior to visit.   Past Medical History:  Diagnosis Date  . Anxiety    pt denies  . Depression    pt denies  . IBS (irritable bowel syndrome)   .  Kidney stones   . Sinusitis   . UC (ulcerative colitis) ([place])   . Vertigo    Past Surgical History:  Procedure Laterality Date  . NASAL ENDOSCOPY W/ BALLON SINUPLASTY    . NASAL SINUS SURGERY  2015   Social History   Socioeconomic History  . Marital status: Married    Spouse name: Joe  . Number of children: 2  . Years of education: Not on file  . Highest education level: Not on file  Occupational History  . 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 Smoking status: Never Smoker  . Smokeless tobacco: Never Used  Vaping Use  . Vaping Use: Never used  Substance and Sexual Activity  . Alcohol use: Not Currently  . Drug use: No  . Sexual activity: Yes    Partners: Male    Birth control/protection: Post-menopausal  Other Topics Concern  . Not on file  Social History Narrative   Works three 10 hours days at [employer] and twice a week she watches her grandchildren.    Social Determinants of Health   Financial Resource Strain:   . Difficulty of Paying Living Expenses: Not on file  Food Insecurity:   . Worried About Charity fundraiser in the Last Year: Not on file  . Ran Out of Food in the Last Year: Not on file  Transportation Needs:   . Lack of  Transportation (Medical): Not on file  . Lack of Transportation (Non-Medical): Not on file  Physical Activity:   . Days of Exercise per Week: Not on file  . Minutes of Exercise per Session: Not on file  Stress:   . Feeling of Stress : Not on file  Social Connections:   . Frequency of Communication with Friends and Family: Not on file  . Frequency of Social Gatherings with Friends and Family: Not on file  . Attends Religious Services: Not on file  . Active Member of Clubs or Organizations: Not on file  . Attends Archivist Meetings: Not on file  . Marital Status: Not on file   Family History  Problem Relation Age of Onset  . Hyperlipidemia Mother   . COPD Father   . Allergies  Son   . Colon cancer Neg Hx   . Stomach cancer Neg Hx   . Esophageal cancer Neg Hx   . Pancreatic cancer Neg Hx   . Liver disease Neg Hx       Review of Systems: Pertinent positive and negative review of systems were noted in the above HPI section. All other review of systems were otherwise negative.    Physical Exam: General: Well developed, well nourished, no acute distress Head: Normocephalic and atraumatic Eyes:  sclerae anicteric, EOMI Ears: Normal auditory acuity Mouth: Not examined, mask on during Covid-19 pandemic Neck: Supple, no masses or thyromegaly Lungs: Clear throughout to auscultation Heart: Regular rate and rhythm; no murmurs, rubs or bruits Abdomen: Soft, non tender and non distended. No masses, hepatosplenomegaly or hernias noted. Normal Bowel sounds Rectal: Not done Musculoskeletal: Symmetrical with no gross deformities  Skin: No lesions on visible extremities Pulses:  Normal pulses noted Extremities: No clubbing, cyanosis, edema or deformities noted Neurological: Alert oriented x 4, grossly nonfocal Cervical Nodes:  No significant cervical adenopathy Inguinal Nodes: No significant inguinal adenopathy Psychological:  Alert and cooperative. Normal mood and affect   Assessment and Recommendations:  1. IBS.  Infrequent symptoms that she clearly associates with stress.  Glycopyrrolate 1 mg p.o. twice daily as needed.  Possible history of ulcerative proctitis in the past without findings of proctitis on 2013 colonoscopy and no ongoing symptoms to suggest proctitis or colitis however fecal calprotectin was elevated in September.  If she has more frequent symptoms of diarrhea, mucus bleeding or abdominal pain she is advised to contact us and we will consider colonoscopy to further evaluate.  2. GERD.  Follow antireflux measures and continue AcipHex 20 mg daily as needed.  3. Sinusitis.  Patient relates she is being treated with Augmentin for sinusitis which is not  working and requests a change to Levaquin.  Levaquin 500 mg p.o. daily for 7 days.  If symptoms are not resolved she is advised to follow-up with her PCP.   4. CRC screening, average risk.  A 10-year interval screening colonoscopy is recommended in December 2023.   cc: Steele Sizer, MD [address] [place],  [place] [address]

## 2020-10-25 NOTE — Patient Instructions (Addendum)
We have sent the following medications to your pharmacy for you to pick up at your convenience: Aciphex, Robinul and Levaquin.   Normal BMI (Body Mass Index- based on height and weight) is between 19 and 25. Your BMI today is Body mass index is 23.89 kg/m. Marland Kitchen Please consider follow up  regarding your BMI with your Primary Care Provider.  Thank you for choosing me and [facility].  Pricilla Riffle. Dagoberto Ligas., MD., Marval Regal

## 2020-12-31 DIAGNOSIS — R002 Palpitations: Secondary | ICD-10-CM | POA: Insufficient documentation

## 2020-12-31 NOTE — Progress Notes (Signed)
Name: Christina Bradley   MRN: [id number]    DOB: [birth year]   Date:01/01/2021       Progress Note  Subjective  Chief Complaint  Anxiety  HPI  Palpitation: she has been having recurrent palpitation, but this past weekend it was more intense and took 30 minutes to resolve. Not associated with nausea, vomiting, diaphoresis of chest pain. It only happens when at home with her husband. She states she worries about him since he had the stroke and has left side palsy. 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 She saw Dr. Ubaldo Glassing ( cardiologist ) yesterday and he advised her to resume duloxetine, gave her some alprazolam and placed a holter monitor . She came in today to get a refill of Duloxetine  GAD: she has been worried, sometimes has difficulty sleeping if husband has a hard day, but most of the time is too tired to stay awake, work has been going well but feels nervous around the house. The only thing that she has been doing for leisure is spend time with her grandsons.   IBS and ulcerative colitis: sees GI but takes medications only prn. No recent episodes of abdominal pain,hematochezia or blood in stools.     Patient Active Problem List   Diagnosis Date Noted  . Palpitations 12/31/2020  . White coat hypertension 11/29/2015  . GAD (generalized anxiety disorder) 11/29/2015  . GERD without esophagitis 11/29/2015  . IBS (irritable bowel syndrome) 09/07/2008  . Irritable bowel syndrome without diarrhea 09/07/2008  . Ulcerative colitis ([place]) 09/06/2008    Past Surgical History:  Procedure Laterality Date  . NASAL ENDOSCOPY W/ BALLON SINUPLASTY    . NASAL SINUS SURGERY  2015    Family History  Problem Relation Age of Onset  . Hyperlipidemia Mother   . COPD Father   . Allergies Son   . Colon cancer Neg Hx   . Stomach cancer Neg Hx   . Esophageal cancer Neg Hx   . Pancreatic cancer Neg Hx   . Liver disease Neg Hx     Social History   Tobacco Use  .  Smoking status: Never Smoker  . Smokeless tobacco: Never Used  Substance Use Topics  . Alcohol use: Not Currently     Current Outpatient Medications:  .  ALPRAZolam (XANAX) 0.25 MG tablet, Take 1 tablet by mouth daily as needed., Disp: , Rfl:  .  DULoxetine (CYMBALTA) 60 MG capsule, Take 1 capsule (60 mg total) by mouth daily. (Patient taking differently: Take 60 mg by mouth as needed.), Disp: 90 capsule, Rfl: 3 .  glycopyrrolate (ROBINUL) 1 MG tablet, Take 1 tablet (1 mg total) by mouth 2 (two) times daily as needed., Disp: 60 tablet, Rfl: 3 .  RABEprazole (ACIPHEX) 20 MG tablet, Take 1 tablet (20 mg total) by mouth daily. (Patient taking differently: Take 20 mg by mouth as needed.), Disp: 30 tablet, Rfl: 11  No Known Allergies  I personally reviewed active problem list, medication list, allergies, family history, social history, health maintenance with the patient/caregiver today.   ROS  Constitutional: Negative for fever or weight change.  Respiratory: Negative for cough and shortness of breath.   Cardiovascular: Negative for chest pain , positive for intermittent palpitations.  Gastrointestinal: Negative for abdominal pain, no bowel changes.  Musculoskeletal: Negative for gait problem or joint swelling.  Skin: Negative for rash.  Neurological: Negative for dizziness or headache.  No other specific complaints in a complete  review of systems (except as listed in HPI above).  Objective  Vitals:   01/01/21 1525  BP: 132/86  Pulse: 91  Resp: 16  Temp: 98.3 F (36.8 C)  TempSrc: Oral  SpO2: 99%  Weight: 152 lb 9.6 oz (69.2 kg)  Height: 5' 6"  (1.676 m)    Body mass index is 24.63 kg/m.  Physical Exam  Constitutional: Patient appears well-developed and well-nourished. No distress.  HEENT: head atraumatic, normocephalic, pupils equal and reactive to light,  neck supple Cardiovascular: Normal rate, regular rhythm and normal heart sounds.  No murmur heard. No BLE  edema. Pulmonary/Chest: Effort normal and breath sounds normal. No respiratory distress. Abdominal: Soft.  There is no tenderness. Psychiatric: Patient has a normal mood and affect. behavior is normal. Judgment and thought content normal.  PHQ2/9: Depression screen [facility] 2/9 01/01/2021 02/02/2019 12/13/2017 11/18/2016 11/29/2015  Decreased Interest 0 0 0 0 0  Down, Depressed, Hopeless 0 0 0 0 0  PHQ - 2 Score 0 0 0 0 0  Altered sleeping - 1 - - -  Tired, decreased energy - 1 - - -  Change in appetite - 0 - - -  Feeling bad or failure about yourself  - 0 - - -  Trouble concentrating - 0 - - -  Moving slowly or fidgety/restless - 0 - - -  Suicidal thoughts - 0 - - -  PHQ-9 Score - 2 - - -  Difficult doing work/chores - Not difficult at all - - -    phq negative  GAD 7 : Generalized Anxiety Score 01/01/2021 02/02/2019 11/29/2015  Nervous, Anxious, on Edge 1 1 0  Control/stop worrying 1 1 1   Worry too much - different things 1 0 1  Trouble relaxing 0 1 3  Restless 0 0 3  Easily annoyed or irritable 0 1 0  Afraid - awful might happen 1 0 1  Total GAD 7 Score 4 4 9   Anxiety Difficulty - Somewhat difficult Somewhat difficult   Positive   Fall Risk: Fall Risk  01/01/2021 02/02/2019 12/13/2017 12/13/2017 11/18/2016  Falls in the past year? 0 0 Yes No No  Comment - - fell off a ladder June 2018  - -  Number falls in past yr: 0 0 1 - -  Injury with Fall? 0 0 Yes - -  Risk for fall due to : - - History of fall(s) - -  Follow up - Falls evaluation completed Education provided - -      Functional Status Survey: Is the patient deaf or have difficulty hearing?: No Does the patient have difficulty seeing, even when wearing glasses/contacts?: No Does the patient have difficulty concentrating, remembering, or making decisions?: No Does the patient have difficulty walking or climbing stairs?: No Does the patient have difficulty dressing or bathing?: No Does the patient have difficulty doing  errands alone such as visiting a doctor's office or shopping?: No    Assessment & Plan  1. Other ulcerative colitis without complication ([place])   2. GAD (generalized anxiety disorder)  - DULoxetine (CYMBALTA) 60 MG capsule; Take 1 capsule (60 mg total) by mouth daily.  Dispense: 90 capsule; Refill: 3  3. Irritable bowel syndrome, unspecified type  - DULoxetine (CYMBALTA) 60 MG capsule; Take 1 capsule (60 mg total) by mouth daily.  Dispense: 90 capsule; Refill: 3  4. Encounter for screening mammogram for malignant neoplasm of breast  - MM 3D SCREEN BREAST BILATERAL; Future  5. Palpitation  Discussed getting  labs such as CBC, comp panel and TSH but she would like to wait until her CPE in the Summer

## 2021-01-01 ENCOUNTER — Ambulatory Visit (INDEPENDENT_AMBULATORY_CARE_PROVIDER_SITE_OTHER): Payer: Managed Care, Other (non HMO) | Admitting: Family Medicine

## 2021-01-01 ENCOUNTER — Encounter: Payer: Self-pay | Admitting: Family Medicine

## 2021-01-01 ENCOUNTER — Other Ambulatory Visit: Payer: Self-pay

## 2021-01-01 VITALS — BP 132/86 | HR 91 | Temp 98.3°F | Resp 16 | Ht 66.0 in | Wt 152.6 lb

## 2021-01-01 DIAGNOSIS — K5180 Other ulcerative colitis without complications: Secondary | ICD-10-CM | POA: Diagnosis not present

## 2021-01-01 DIAGNOSIS — R002 Palpitations: Secondary | ICD-10-CM

## 2021-01-01 DIAGNOSIS — K589 Irritable bowel syndrome without diarrhea: Secondary | ICD-10-CM

## 2021-01-01 DIAGNOSIS — F411 Generalized anxiety disorder: Secondary | ICD-10-CM | POA: Diagnosis not present

## 2021-01-01 DIAGNOSIS — Z1231 Encounter for screening mammogram for malignant neoplasm of breast: Secondary | ICD-10-CM

## 2021-01-01 MED ORDER — DULOXETINE HCL 60 MG PO CPEP: 60.0000 mg | ORAL_CAPSULE | Freq: Every day | ORAL | 3 refills | Status: DC

## 2021-01-02 ENCOUNTER — Telehealth: Payer: Self-pay | Admitting: Family Medicine

## 2021-01-02 NOTE — Telephone Encounter (Signed)
Pt decline to sch for covid vaccine

## 2021-01-17 ENCOUNTER — Encounter: Payer: Managed Care, Other (non HMO) | Admitting: Family Medicine

## 2021-01-20 ENCOUNTER — Ambulatory Visit: Payer: Managed Care, Other (non HMO) | Admitting: Family Medicine

## 2021-01-21 ENCOUNTER — Encounter: Payer: Self-pay | Admitting: Family Medicine

## 2021-01-21 DIAGNOSIS — Z1231 Encounter for screening mammogram for malignant neoplasm of breast: Secondary | ICD-10-CM

## 2021-01-22 ENCOUNTER — Encounter: Payer: Managed Care, Other (non HMO) | Admitting: Family Medicine

## 2021-02-05 LAB — HM MAMMOGRAPHY

## 2021-06-19 NOTE — Patient Instructions (Signed)
Preventive Care 68-59 Years Old, Female Preventive care refers to lifestyle choices and visits with your health care provider that can promote health and wellness. This includes: A yearly physical exam. This is also called an annual wellness visit. Regular dental and eye exams. Immunizations. Screening for certain conditions. Healthy lifestyle choices, such as: Eating a healthy diet. Getting regular exercise. Not using drugs or products that contain nicotine and tobacco. Limiting alcohol use. What can I expect for my preventive care visit? Physical exam Your health care provider will check your: Height and weight. These may be used to calculate your BMI (body mass index). BMI is a measurement that tells if you are at a healthy weight. Heart rate and blood pressure. Body temperature. Skin for abnormal spots. Counseling Your health care provider may ask you questions about your: Past medical problems. Family's medical history. Alcohol, tobacco, and drug use. Emotional well-being. Home life and relationship well-being. Sexual activity. Diet, exercise, and sleep habits. Work and work Statistician. Access to firearms. Method of birth control. Menstrual cycle. Pregnancy history. What immunizations do I need?  Vaccines are usually given at various ages, according to a schedule. Your health care provider will recommend vaccines for you based on your age, medicalhistory, and lifestyle or other factors, such as travel or where you work. What tests do I need? Blood tests Lipid and cholesterol levels. These may be checked every 5 years, or more often if you are over 59 years old. Hepatitis C test. Hepatitis B test. Screening Lung cancer screening. You may have this screening every year starting at age 59 if you have a 30-pack-year history of smoking and currently smoke or have quit within the past 15 years. Colorectal cancer screening. All adults should have this screening starting at  age 59 and continuing until age 59. Your health care provider may recommend screening at age 59 if you are at increased risk. You will have tests every 1-10 years, depending on your results and the type of screening test. Diabetes screening. This is done by checking your blood sugar (glucose) after you have not eaten for a while (fasting). You may have this done every 1-3 years. Mammogram. This may be done every 1-2 years. Talk with your health care provider about when you should start having regular mammograms. This may depend on whether you have a family history of breast cancer. BRCA-related cancer screening. This may be done if you have a family history of breast, ovarian, tubal, or peritoneal cancers. Pelvic exam and Pap test. This may be done every 3 years starting at age 59. Starting at age 59, this may be done every 5 years if you have a Pap test in combination with an HPV test. Other tests STD (sexually transmitted disease) testing, if you are at risk. Bone density scan. This is done to screen for osteoporosis. You may have this scan if you are at high risk for osteoporosis. Talk with your health care provider about your test results, treatment options,and if necessary, the need for more tests. Follow these instructions at home: Eating and drinking  Eat a diet that includes fresh fruits and vegetables, whole grains, lean protein, and low-fat dairy products. Take vitamin and mineral supplements as recommended by your health care provider. Do not drink alcohol if: Your health care provider tells you not to drink. You are pregnant, may be pregnant, or are planning to become pregnant. If you drink alcohol: Limit how much you have to 0-1 drink a day. Be aware  of how much alcohol is in your drink. In the [place] one drink equals one 12 oz bottle of beer (355 mL), one 5 oz glass of wine (148 mL), or one 1 oz glass of hard liquor (44 mL).  Lifestyle Take daily care of your teeth and  gums. Brush your teeth every morning and night with fluoride toothpaste. Floss one time each day. Stay active. Exercise for at least 30 minutes 5 or more days each week. Do not use any products that contain nicotine or tobacco, such as cigarettes, e-cigarettes, and chewing tobacco. If you need help quitting, ask your health care provider. Do not use drugs. If you are sexually active, practice safe sex. Use a condom or other form of protection to prevent STIs (sexually transmitted infections). If you do not wish to become pregnant, use a form of birth control. If you plan to become pregnant, see your health care provider for a prepregnancy visit. If told by your health care provider, take low-dose aspirin daily starting at age 59. Find healthy ways to cope with stress, such as: Meditation, yoga, or listening to music. Journaling. Talking to a trusted person. Spending time with friends and family. Safety Always wear your seat belt while driving or riding in a vehicle. Do not drive: If you have been drinking alcohol. Do not ride with someone who has been drinking. When you are tired or distracted. While texting. Wear a helmet and other protective equipment during sports activities. If you have firearms in your house, make sure you follow all gun safety procedures. What's next? Visit your health care provider once a year for an annual wellness visit. Ask your health care provider how often you should have your eyes and teeth checked. Stay up to date on all vaccines. This information is not intended to replace advice given to you by your health care provider. Make sure you discuss any questions you have with your healthcare provider. Document Revised: 08/27/2020 Document Reviewed: 08/04/2018 Elsevier Patient Education  2022 [employer].

## 2021-06-19 NOTE — Progress Notes (Signed)
Name: Christina Bradley   MRN: [id number]    DOB: [birth year]   Date:06/20/2021       Progress Note  Subjective  Chief Complaint  Annual Exam  HPI  Patient presents for annual CPE.  Diet: balanced  Exercise: very active   [place] Visit from 01/01/2021 in [facility]  AUDIT-C Score 0      Depression: Phq 9 is  negative Depression screen [facility] 2/9 06/20/2021 01/01/2021 02/02/2019 12/13/2017 11/18/2016  Decreased Interest 0 0 0 0 0  Down, Depressed, Hopeless 0 0 0 0 0  PHQ - 2 Score 0 0 0 0 0  Altered sleeping - - 1 - -  Tired, decreased energy - - 1 - -  Change in appetite - - 0 - -  Feeling bad or failure about yourself  - - 0 - -  Trouble concentrating - - 0 - -  Moving slowly or fidgety/restless - - 0 - -  Suicidal thoughts - - 0 - -  PHQ-9 Score - - 2 - -  Difficult doing work/chores - - Not difficult at all - -   Hypertension: BP Readings from Last 3 Encounters:  06/20/21 128/86  01/01/21 132/86  10/25/20 124/72   Obesity: Wt Readings from Last 3 Encounters:  06/20/21 151 lb (68.5 kg)  01/01/21 152 lb 9.6 oz (69.2 kg)  10/25/20 148 lb (67.1 kg)   BMI Readings from Last 3 Encounters:  06/20/21 24.37 kg/m  01/01/21 24.63 kg/m  10/25/20 23.89 kg/m     Vaccines:  Shingrix: discussed with patient  Pneumonia: she refused  Flu: educated and discussed with patient.  Hep C Screening: N/A STD testing and prevention (HIV/chl/gon/syphilis): N/A Intimate partner violence:negative Sexual History :no pain  Menstrual History/LMP/Abnormal Bleeding: discussed post-menopausal bleeding  Incontinence Symptoms: no problem  Breast cancer:  - Last Mammogram: Ordered 01/22/21 - BRCA gene screening: N/A  Osteoporosis: Discussed high calcium and vitamin D supplementation, weight bearing exercises  Cervical cancer screening: 02/02/19  Skin cancer: Discussed monitoring for atypical lesions  Colorectal cancer: 11/10/12   Lung cancer:  Low Dose CT  Chest recommended if Age 63-80 years, 20 pack-year currently smoking OR have quit w/in 15years. Patient does not qualify.   ECG: N/A  Advanced Care Planning: A voluntary discussion about advance care planning including the explanation and discussion of advance directives.  Discussed health care proxy and Living will, and the patient was able to identify a health care proxy as husband   Lipids: Lab Results  Component Value Date   CHOL 246 (H) 02/02/2019   CHOL 208 (H) 12/16/2017   Lab Results  Component Value Date   HDL 43 (L) 02/02/2019   HDL 50 (L) 12/16/2017   Lab Results  Component Value Date   LDLCALC 166 (H) 02/02/2019   LDLCALC 140 (H) 12/16/2017   Lab Results  Component Value Date   TRIG 209 (H) 02/02/2019   TRIG 82 12/16/2017   Lab Results  Component Value Date   CHOLHDL 5.7 (H) 02/02/2019   CHOLHDL 4.2 12/16/2017   No results found for: LDLDIRECT  Glucose: Glucose, Bld  Date Value Ref Range Status  02/02/2019 82 65 - 99 mg/dL Final    Comment:    .            Fasting reference interval .   12/16/2017 105 (H) 65 - 99 mg/dL Final    Comment:    .  Fasting reference interval . For someone without known diabetes, a glucose value between 100 and 125 mg/dL is consistent with prediabetes and should be confirmed with a follow-up test. .     Patient Active Problem List   Diagnosis Date Noted   Palpitations 12/31/2020   White coat hypertension 11/29/2015   GAD (generalized anxiety disorder) 11/29/2015   GERD without esophagitis 11/29/2015   IBS (irritable bowel syndrome) 09/07/2008   Irritable bowel syndrome without diarrhea 09/07/2008   Ulcerative colitis ([place]) 09/06/2008    Past Surgical History:  Procedure Laterality Date   NASAL ENDOSCOPY W/ BALLON SINUPLASTY     NASAL SINUS SURGERY  2015    Family History  Problem Relation Age of Onset   Hyperlipidemia Mother    COPD Father    Allergies Son    Colon cancer Neg Hx    Stomach  cancer Neg Hx    Esophageal cancer Neg Hx    Pancreatic cancer Neg Hx    Liver disease Neg Hx     Social History   Socioeconomic History   Marital status: Married    Spouse name: Joe   Number of children: 2   Years of education: Not on file   Highest education level: Not on file  Occupational History   Occupation: Data processing manager    Comment: 30 hours a week   Tobacco Use   Smoking status: Never   Smokeless tobacco: Never  Vaping Use   Vaping Use: Never used  Substance and Sexual Activity   Alcohol use: Not Currently   Drug use: No   Sexual activity: Yes    Partners: Male    Birth control/protection: Post-menopausal  Other Topics Concern   Not on file  Social History Narrative   Works three 10 hours days at [employer] and twice a week she watches her grandchildren.    Social Determinants of Health   Financial Resource Strain: Low Risk    Difficulty of Paying Living Expenses: Not hard at all  Food Insecurity: No Food Insecurity   Worried About Charity fundraiser in the Last Year: Never true   [place] in the Last Year: Never true  Transportation Needs: No Transportation Needs   Lack of Transportation (Medical): No   Lack of Transportation (Non-Medical): No  Physical Activity: Sufficiently Active   Days of Exercise per Week: 3 days   Minutes of Exercise per Session: 60 min  Stress: Stress Concern Present   Feeling of Stress : To some extent  Social Connections: Moderately Integrated   Frequency of Communication with Friends and Family: More than three times a week   Frequency of Social Gatherings with Friends and Family: More than three times a week   Attends Religious Services: More than 4 times per year   Active Member of [employer] or Organizations: No   Attends Archivist Meetings: Never   Marital Status: Married  Human resources officer Violence: Not At Risk   Fear of Current or Ex-Partner: No   Emotionally Abused: No   Physically Abused: No    Sexually Abused: No     Current Outpatient Medications:    DULoxetine (CYMBALTA) 60 MG capsule, Take 1 capsule (60 mg total) by mouth daily., Disp: 90 capsule, Rfl: 3   glycopyrrolate (ROBINUL) 1 MG tablet, Take 1 tablet (1 mg total) by mouth 2 (two) times daily as needed., Disp: 60 tablet, Rfl: 3   RABEprazole (ACIPHEX) 20 MG tablet, Take 1 tablet (20 mg  total) by mouth daily. (Patient taking differently: Take 20 mg by mouth as needed.), Disp: 30 tablet, Rfl: 11  No Known Allergies   ROS  Constitutional: Negative for fever or weight change.  Respiratory: Negative for cough and shortness of breath.   Cardiovascular: Negative for chest pain or palpitations.  Gastrointestinal: Negative for abdominal pain, no bowel changes.  Musculoskeletal: Negative for gait problem or joint swelling.  Skin: Negative for rash.  Neurological: Negative for dizziness or headache.  No other specific complaints in a complete review of systems (except as listed in HPI above).   Objective  Vitals:   06/20/21 1500  BP: 128/86  Pulse: 93  Resp: 16  Temp: 98.2 F (36.8 C)  TempSrc: Oral  SpO2: 97%  Weight: 151 lb (68.5 kg)  Height: 5' 6"  (1.676 m)    Body mass index is 24.37 kg/m.  Physical Exam Constitutional: Patient appears well-developed and well-nourished. No distress.  HENT: Head: Normocephalic and atraumatic. Ears: B TMs ok, no erythema or effusion; Nose: not done  Eyes: Conjunctivae and EOM are normal. Pupils are equal, round, and reactive to light. No scleral icterus.  Neck: Normal range of motion. Neck supple. No JVD present. No thyromegaly present.  Cardiovascular: Normal rate, regular rhythm and normal heart sounds.  No murmur heard. No BLE edema. Pulmonary/Chest: Effort normal and breath sounds normal. No respiratory distress. Abdominal: Soft. Bowel sounds are normal, no distension. There is no tenderness. no masses Breast: no lumps or masses, no nipple discharge or rashes FEMALE  GENITALIA:  External genitalia normal External urethra normal Vaginal walls weak, has rectocele  Cervix normal without discharge or lesions Bimanual exam normal without masses RECTAL: not done Musculoskeletal: Normal range of motion, no joint effusions. No gross deformities Neurological: he is alert and oriented to person, place, and time. No cranial nerve deficit. Coordination, balance, strength, speech and gait are normal.  Skin: Skin is warm and dry. No rash noted. She has some erythematous lesions on anterior chest wall advised evaluation by dermatologist  - she will make her own appointment  Psychiatric: Patient has a normal mood and affect. behavior is normal. Judgment and thought content normal.    Fall Risk: Fall Risk  06/20/2021 01/01/2021 02/02/2019 12/13/2017 12/13/2017  Falls in the past year? 0 0 0 Yes No  Comment - - - fell off a ladder June 2018  -  Number falls in past yr: 0 0 0 1 -  Injury with Fall? 0 0 0 Yes -  Risk for fall due to : - - - History of fall(s) -  Follow up - - Falls evaluation completed Education provided -     Functional Status Survey: Is the patient deaf or have difficulty hearing?: No Does the patient have difficulty seeing, even when wearing glasses/contacts?: No Does the patient have difficulty concentrating, remembering, or making decisions?: No Does the patient have difficulty walking or climbing stairs?: No Does the patient have difficulty dressing or bathing?: No Does the patient have difficulty doing errands alone such as visiting a doctor's office or shopping?: No   Assessment & Plan  1. Well adult exam   2. Breast cancer screening by mammogram  Up to date , done at [facility]  3. Dyslipidemia  - Lipid panel  4. Pre-diabetes  - Hemoglobin A1c  5. Cervical cancer screening  - Pap LB (liquid-based)  6. Cervical high risk HPV (human papillomavirus) test positive   7. Long-term use of high-risk medication  - CBC  with  Differential/Platelet - Comprehensive metabolic panel  8. Need for pneumococcal vaccination  Refused  9. Need for shingles vaccine   Refused    -USPSTF grade A and B recommendations reviewed with patient; age-appropriate recommendations, preventive care, screening tests, etc discussed and encouraged; healthy living encouraged; see AVS for patient education given to patient -Discussed importance of 150 minutes of physical activity weekly, eat two servings of fish weekly, eat one serving of tree nuts ( cashews, pistachios, pecans, almonds.Marland Kitchen) every other day, eat 6 servings of fruit/vegetables daily and drink plenty of water and avoid sweet beverages.

## 2021-06-20 ENCOUNTER — Ambulatory Visit (INDEPENDENT_AMBULATORY_CARE_PROVIDER_SITE_OTHER): Payer: Managed Care, Other (non HMO) | Admitting: Family Medicine

## 2021-06-20 ENCOUNTER — Encounter: Payer: Self-pay | Admitting: Family Medicine

## 2021-06-20 ENCOUNTER — Other Ambulatory Visit: Payer: Self-pay

## 2021-06-20 VITALS — BP 128/86 | HR 93 | Temp 98.2°F | Resp 16 | Ht 66.0 in | Wt 151.0 lb

## 2021-06-20 DIAGNOSIS — Z124 Encounter for screening for malignant neoplasm of cervix: Secondary | ICD-10-CM

## 2021-06-20 DIAGNOSIS — R7303 Prediabetes: Secondary | ICD-10-CM | POA: Diagnosis not present

## 2021-06-20 DIAGNOSIS — Z1231 Encounter for screening mammogram for malignant neoplasm of breast: Secondary | ICD-10-CM | POA: Diagnosis not present

## 2021-06-20 DIAGNOSIS — Z79899 Other long term (current) drug therapy: Secondary | ICD-10-CM

## 2021-06-20 DIAGNOSIS — Z0000 Encounter for general adult medical examination without abnormal findings: Secondary | ICD-10-CM | POA: Diagnosis not present

## 2021-06-20 DIAGNOSIS — E785 Hyperlipidemia, unspecified: Secondary | ICD-10-CM

## 2021-06-20 DIAGNOSIS — Z23 Encounter for immunization: Secondary | ICD-10-CM

## 2021-06-20 DIAGNOSIS — R87810 Cervical high risk human papillomavirus (HPV) DNA test positive: Secondary | ICD-10-CM

## 2021-06-24 LAB — PAP LB (LIQUID-BASED)

## 2021-06-24 LAB — SPECIMEN STATUS REPORT

## 2021-07-06 ENCOUNTER — Encounter: Payer: Self-pay | Admitting: Family Medicine

## 2021-07-17 LAB — SPECIMEN STATUS REPORT

## 2021-07-17 LAB — HPV APTIMA: HPV Aptima: NEGATIVE

## 2021-08-19 ENCOUNTER — Other Ambulatory Visit: Payer: Self-pay

## 2022-04-03 ENCOUNTER — Other Ambulatory Visit: Payer: Self-pay | Admitting: Family Medicine

## 2022-04-03 DIAGNOSIS — K589 Irritable bowel syndrome without diarrhea: Secondary | ICD-10-CM

## 2022-04-03 DIAGNOSIS — F411 Generalized anxiety disorder: Secondary | ICD-10-CM

## 2022-04-30 ENCOUNTER — Other Ambulatory Visit: Payer: Self-pay

## 2022-04-30 ENCOUNTER — Telehealth: Payer: Self-pay

## 2022-04-30 DIAGNOSIS — Z1231 Encounter for screening mammogram for malignant neoplasm of breast: Secondary | ICD-10-CM

## 2022-04-30 NOTE — Telephone Encounter (Signed)
Copied from [place]. Topic: General - Inquiry >> Apr 30, 2022  2:13 PM L[place] wrote: Pt has sch her CPE but it is not till Oct. Pt states that it was March of 2022 since last [place] .Pt wants to know if you could send over a order [facility] anytime asap so she will not be lagging behind? Questions. [phone]

## 2022-05-06 ENCOUNTER — Other Ambulatory Visit: Payer: Self-pay | Admitting: Family Medicine

## 2022-05-06 DIAGNOSIS — K589 Irritable bowel syndrome without diarrhea: Secondary | ICD-10-CM

## 2022-05-06 DIAGNOSIS — F411 Generalized anxiety disorder: Secondary | ICD-10-CM

## 2022-05-07 ENCOUNTER — Other Ambulatory Visit: Payer: Self-pay

## 2022-05-07 DIAGNOSIS — K589 Irritable bowel syndrome without diarrhea: Secondary | ICD-10-CM

## 2022-05-07 DIAGNOSIS — F411 Generalized anxiety disorder: Secondary | ICD-10-CM

## 2022-05-14 LAB — HM MAMMOGRAPHY

## 2022-06-02 ENCOUNTER — Other Ambulatory Visit: Payer: Self-pay | Admitting: Internal Medicine

## 2022-06-02 DIAGNOSIS — F411 Generalized anxiety disorder: Secondary | ICD-10-CM

## 2022-06-02 DIAGNOSIS — K589 Irritable bowel syndrome without diarrhea: Secondary | ICD-10-CM

## 2022-06-04 ENCOUNTER — Ambulatory Visit: Payer: Managed Care, Other (non HMO) | Admitting: Gastroenterology

## 2022-06-04 ENCOUNTER — Encounter: Payer: Self-pay | Admitting: Gastroenterology

## 2022-06-04 VITALS — BP 154/84 | HR 96 | Ht 65.75 in | Wt 164.1 lb

## 2022-06-04 DIAGNOSIS — Z1212 Encounter for screening for malignant neoplasm of rectum: Secondary | ICD-10-CM

## 2022-06-04 DIAGNOSIS — K219 Gastro-esophageal reflux disease without esophagitis: Secondary | ICD-10-CM

## 2022-06-04 DIAGNOSIS — Z1211 Encounter for screening for malignant neoplasm of colon: Secondary | ICD-10-CM

## 2022-06-04 DIAGNOSIS — K581 Irritable bowel syndrome with constipation: Secondary | ICD-10-CM | POA: Diagnosis not present

## 2022-06-04 DIAGNOSIS — R0989 Other specified symptoms and signs involving the circulatory and respiratory systems: Secondary | ICD-10-CM | POA: Diagnosis not present

## 2022-06-04 MED ORDER — NA SULFATE-K SULFATE-MG SULF 17.5-3.13-1.6 GM/177ML PO SOLN: 1.0000 | Freq: Once | ORAL | 0 refills | Status: AC

## 2022-06-04 NOTE — Patient Instructions (Signed)
Take your Aciphex every day.  You have been scheduled for an endoscopy and colonoscopy. Please follow the written instructions given to you at your visit today. Please pick up your prep supplies at the pharmacy within the next 1-3 days. If you use inhalers (even only as needed), please bring them with you on the day of your procedure.  The  GI providers would like to encourage you to use [facility] to communicate with providers for non-urgent requests or questions.  Due to long hold times on the telephone, sending your provider a message by [facility] may be a faster and more efficient way to get a response.  Please allow 48 business hours for a response.  Please remember that this is for non-urgent requests.   Due to recent changes in healthcare laws, you may see the results of your imaging and laboratory studies on MyChart before your provider has had a chance to review them.  We understand that in some cases there may be results that are confusing or concerning to you. Not all laboratory results come back in the same time frame and the provider may be waiting for multiple results in order to interpret others.  Please give [place] 48 hours in order for your provider to thoroughly review all the results before contacting the office for clarification of your results.   Thank you for choosing me and [facility].  Pricilla Riffle. Dagoberto Ligas., MD., Marval Regal

## 2022-06-04 NOTE — Progress Notes (Signed)
    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 The risks (including bleeding, perforation, infection, missed lesions, medication reactions and possible hospitalization or surgery if complications occur), benefits, and alternatives to colonoscopy with possible biopsy and possible polypectomy were discussed with the patient and they consent to proceed.  The risks (including bleeding, perforation, infection, missed lesions, medication reactions and possible hospitalization or surgery if complications occur), benefits, and alternatives to endoscopy with possible biopsy and possible dilation were discussed with the patient and they consent to proceed.   Take Aciphex 20 mg po qd (not prn) for 3 months and follow antireflux measures Continue Robinul 1 mg po bid prn ENT evaluation as planning in August   HPI    This is a 61 year old female with a history of IBS, GERD.  She relates intermittent difficulties with constipation and intermittent left lower quadrant pain.  Her abdominal pain responds well to glycopyrrolate although she takes it infrequently.  She has a questionable history of ulcerative proctitis in the past with no activity on her colonoscopy in 2013 and no colitis symptoms for many years.  She relates frequent problems with a sore throat, tightness in her throat and dryness in her throat.  She also notes mucus and throat congestion.  She takes Aciphex as needed for infrequent heartburn.  No prior EGD.  She occasionally has difficulty swallowing dry chips but does not note difficulties with other foods. Denies weight loss, diarrhea, change in stool caliber, melena, hematochezia, nausea, vomiting, chest pain.    Labs / Imaging       Latest Ref Rng & Units 02/02/2019   11:04 AM 12/16/2017    8:20 AM  Hepatic Function  Total Protein 6.1 - 8.1 g/dL 7.1  6.8   AST  10 - 35 U/L 16  21   ALT 6 - 29 U/L 13  17   Total Bilirubin 0.2 - 1.2 mg/dL 0.3  0.3        Latest Ref Rng & Units 02/02/2019   11:04 AM 12/16/2017    8:20 AM 06/25/2015    7:33 AM  CBC  WBC 3.8 - 10.8 Thousand/uL 8.6  6.5  6.3   Hemoglobin 11.7 - 15.5 g/dL 14.8  13.9  14.5   Hematocrit 35.0 - 45.0 % 43.8  40.5  42.9   Platelets 140 - 400 Thousand/uL 283  234  216.0     Current Medications, Allergies, Past Medical History, Past Surgical History, Family History and Social History were reviewed in [employer] record.   Physical Exam: General: Well developed, well nourished, no acute distress Head: Normocephalic and atraumatic Eyes: Sclerae anicteric, EOMI Ears: Normal auditory acuity Mouth: Not examined Lungs: Clear throughout to auscultation Heart: Regular rate and rhythm; no murmurs, rubs or bruits Abdomen: Soft, non tender and non distended. No masses, hepatosplenomegaly or hernias noted. Normal Bowel sounds Rectal: Deferred to colonoscopy  Musculoskeletal: Symmetrical with no gross deformities  Pulses:  Normal pulses noted Extremities: No clubbing, cyanosis, edema or deformities noted Neurological: Alert oriented x 4, grossly nonfocal Psychological:  Alert and cooperative. Normal mood and affect   Sudie Bandel T. Fuller Plan, MD 06/04/2022, 2:49 PM

## 2022-07-12 ENCOUNTER — Encounter: Payer: Self-pay | Admitting: Gastroenterology

## 2022-07-13 ENCOUNTER — Other Ambulatory Visit: Payer: Self-pay

## 2022-07-13 MED ORDER — RABEPRAZOLE SODIUM 20 MG PO TBEC: 20.0000 mg | DELAYED_RELEASE_TABLET | Freq: Every day | ORAL | 11 refills | Status: DC

## 2022-08-04 ENCOUNTER — Other Ambulatory Visit: Payer: Self-pay | Admitting: Family Medicine

## 2022-08-04 DIAGNOSIS — K589 Irritable bowel syndrome without diarrhea: Secondary | ICD-10-CM

## 2022-08-04 DIAGNOSIS — F411 Generalized anxiety disorder: Secondary | ICD-10-CM

## 2022-09-02 ENCOUNTER — Ambulatory Visit: Payer: Managed Care, Other (non HMO) | Admitting: Gastroenterology

## 2022-09-02 ENCOUNTER — Encounter: Payer: Managed Care, Other (non HMO) | Admitting: Gastroenterology

## 2022-09-02 ENCOUNTER — Encounter: Payer: Self-pay | Admitting: Gastroenterology

## 2022-09-02 VITALS — BP 135/66 | HR 89 | Temp 97.5°F | Resp 12 | Ht 65.0 in | Wt 164.0 lb

## 2022-09-02 DIAGNOSIS — Z1212 Encounter for screening for malignant neoplasm of rectum: Secondary | ICD-10-CM | POA: Diagnosis not present

## 2022-09-02 DIAGNOSIS — D122 Benign neoplasm of ascending colon: Secondary | ICD-10-CM

## 2022-09-02 DIAGNOSIS — Z1211 Encounter for screening for malignant neoplasm of colon: Secondary | ICD-10-CM | POA: Diagnosis present

## 2022-09-02 DIAGNOSIS — D125 Benign neoplasm of sigmoid colon: Secondary | ICD-10-CM

## 2022-09-02 MED ORDER — SODIUM CHLORIDE 0.9 % IV SOLN: 500.0000 mL | Freq: Once | INTRAVENOUS | Status: DC

## 2022-09-02 NOTE — Patient Instructions (Signed)
Handouts on polyps given to patient. Await pathology results. Resume previous diet and continue present medications. Repeat colonoscopy in 3 years for surveillance!   YOU HAD AN ENDOSCOPIC PROCEDURE TODAY AT [facility]:   Refer to the procedure report that was given to you for any specific questions about what was found during the examination.  If the procedure report does not answer your questions, please call your gastroenterologist to clarify.  If you requested that your care partner not be given the details of your procedure findings, then the procedure report has been included in a sealed envelope for you to review at your convenience later.  YOU SHOULD EXPECT: Some feelings of bloating in the abdomen. Passage of more gas than usual.  Walking can help get rid of the air that was put into your GI tract during the procedure and reduce the bloating. If you had a lower endoscopy (such as a colonoscopy or flexible sigmoidoscopy) you may notice spotting of blood in your stool or on the toilet paper. If you underwent a bowel prep for your procedure, you may not have a normal bowel movement for a few days.  Please Note:  You might notice some irritation and congestion in your nose or some drainage.  This is from the oxygen used during your procedure.  There is no need for concern and it should clear up in a day or so.  SYMPTOMS TO REPORT IMMEDIATELY:  Following lower endoscopy (colonoscopy or flexible sigmoidoscopy):  Excessive amounts of blood in the stool  Significant tenderness or worsening of abdominal pains  Swelling of the abdomen that is new, acute  Fever of 100F or higher  For urgent or emergent issues, a gastroenterologist can be reached at any hour by calling [phone]. Do not use MyChart messaging for urgent concerns.    DIET:  We do recommend a small meal at first, but then you may proceed to your regular diet.  Drink plenty of fluids but you should avoid  alcoholic beverages for 24 hours.  ACTIVITY:  You should plan to take it easy for the rest of today and you should NOT DRIVE or use heavy machinery until tomorrow (because of the sedation medicines used during the test).    FOLLOW UP: Our staff will call the number listed on your records the next business day following your procedure.  We will call around 7:15- 8:00 am to check on you and address any questions or concerns that you may have regarding the information given to you following your procedure. If we do not reach you, we will leave a message.     If any biopsies were taken you will be contacted by phone or by letter within the next 1-3 weeks.  Please call us at [phone] if you have not heard about the biopsies in 3 weeks.    SIGNATURES/CONFIDENTIALITY: You and/or your care partner have signed paperwork which will be entered into your electronic medical record.  These signatures attest to the fact that that the information above on your After Visit Summary has been reviewed and is understood.  Full responsibility of the confidentiality of this discharge information lies with you and/or your care-partner.

## 2022-09-02 NOTE — Progress Notes (Signed)
Called to room to assist during endoscopic procedure.  Patient ID and intended procedure confirmed with present staff. Received instructions for my participation in the procedure from the performing physician.  

## 2022-09-02 NOTE — Op Note (Signed)
[facility] Patient Name: Christina Bradley Procedure Date: 09/02/2022 8:21 AM MRN: [id number] Endoscopist: Ladene Artist , MD Age: 61 Referring MD:  Date of Birth: [birth year] Gender: Female Account #: [id number] Procedure:                Colonoscopy Indications:              Screening for colorectal malignant neoplasm Medicines:                Monitored Anesthesia Care Procedure:                Pre-Anesthesia Assessment:                           - Prior to the procedure, a History and Physical                            was performed, and patient medications and                            allergies were reviewed. The patient's tolerance of                            previous anesthesia was also reviewed. The risks                            and benefits of the procedure and the sedation                            options and risks were discussed with the patient.                            All questions were answered, and informed consent                            was obtained. Prior Anticoagulants: The patient has                            taken no previous anticoagulant or antiplatelet                            agents. ASA Grade Assessment: II - A patient with                            mild systemic disease. After reviewing the risks                            and benefits, the patient was deemed in                            satisfactory condition to undergo the procedure.                           After obtaining informed consent, the colonoscope  was passed under direct vision. Throughout the                            procedure, the patient's blood pressure, pulse, and                            oxygen saturations were monitored continuously. The                            Olympus CF-HQ190L ([id number]) Colonoscope was                            introduced through the anus and advanced to the the                            ileocecal valve. The  ileocecal valve and the rectum                            were photographed. The quality of the bowel                            preparation was fair. The colonoscopy was                            technically difficult and complex due to inadequate                            bowel prep, a redundant colon, significant looping                            and a tortuous colon. Successful completion of the                            procedure was aided by withdrawing the scope and                            replacing with the pediatric colonoscope, applying                            abdominal pressure and lavage. The patient                            tolerated the procedure well. Scope In: Scope Out: Findings:                 The perianal and digital rectal examinations were                            normal.                           Two sessile polyps were found in the sigmoid colon                            and ascending  colon. The polyps were 7 to 8 mm in                            size. These polyps were removed with a cold snare.                            Resection and retrieval were complete.                           The exam was otherwise without abnormality on                            direct and retroflexion views. Complications:            No immediate complications. Estimated blood loss:                            None. Estimated Blood Loss:     Estimated blood loss: none. Impression:               - Preparation of the colon was fair.                           - Incomplete exam to IC valve                           - Two 7 to 8 mm polyps in the sigmoid colon and in                            the ascending colon, removed with a cold snare.                            Resected and retrieved.                           - The examination was otherwise normal on direct                            and retroflexion views. Recommendation:           - Repeat colonoscopy in 3 years due  to fair prep                            and for surveillance based on pathology results                            with a more extensive bowel prep.                           - Patient has a contact number available for                            emergencies. The signs and symptoms of potential  delayed complications were discussed with the                            patient. Return to normal activities tomorrow.                            Written discharge instructions were provided to the                            patient.                           - Resume previous diet.                           - Continue present medications.                           - Await pathology results. Ladene Artist, MD 09/02/2022 9:08:05 AM This report has been signed electronically.

## 2022-09-02 NOTE — Progress Notes (Signed)
History & Physical  Primary Care Physician:  Steele Sizer, MD Primary Gastroenterologist: Lucio Edward, MD  CHIEF COMPLAINT:  CRC screening   HPI: Christina Bradley is a 61 y.o. female here for CRC screening with colonoscopy.    Past Medical History:  Diagnosis Date   Anxiety    pt denies   Depression    pt denies   GERD (gastroesophageal reflux disease)    IBS (irritable bowel syndrome)    Kidney stones    Sinusitis    UC (ulcerative colitis) ([place])    Vertigo     Past Surgical History:  Procedure Laterality Date   NASAL ENDOSCOPY W/ BALLON SINUPLASTY     NASAL SINUS SURGERY  2015    Prior to Admission medications   Medication Sig Start Date End Date Taking? Authorizing Provider  DULoxetine (CYMBALTA) 60 MG capsule TAKE 1 CAPSULE BY MOUTH ONCE DAILY 08/04/22  Yes Sowles, Drue Stager, MD  glycopyrrolate (ROBINUL) 1 MG tablet Take 1 tablet (1 mg total) by mouth 2 (two) times daily as needed. 10/25/20  Yes Ladene Artist, MD  RABEprazole (ACIPHEX) 20 MG tablet Take 1 tablet (20 mg total) by mouth daily. 07/13/22  Yes Ladene Artist, MD  cholecalciferol (VITAMIN D3) 25 MCG (1000 UNIT) tablet Take 1,000-2,000 Units by mouth as needed.    [provider]    Current Outpatient Medications  Medication Sig Dispense Refill   DULoxetine (CYMBALTA) 60 MG capsule TAKE 1 CAPSULE BY MOUTH ONCE DAILY 90 capsule 0   glycopyrrolate (ROBINUL) 1 MG tablet Take 1 tablet (1 mg total) by mouth 2 (two) times daily as needed. 60 tablet 3   RABEprazole (ACIPHEX) 20 MG tablet Take 1 tablet (20 mg total) by mouth daily. 30 tablet 11   cholecalciferol (VITAMIN D3) 25 MCG (1000 UNIT) tablet Take 1,000-2,000 Units by mouth as needed.     Current Facility-Administered Medications  Medication Dose Route Frequency Provider Last Rate Last Admin   0.9 %  sodium chloride infusion  500 mL Intravenous Once Ladene Artist, MD        Allergies as of 09/02/2022   (No Known Allergies)     Family History  Problem Relation Age of Onset   Hyperlipidemia Mother    COPD Father    Allergies Son    Colon cancer Neg Hx    Stomach cancer Neg Hx    Esophageal cancer Neg Hx    Pancreatic cancer Neg Hx    Liver disease Neg Hx    Rectal cancer Neg Hx     Social History   Socioeconomic History   Marital status: Married    Spouse name: Joe   Number of children: 2   Years of education: Not on file   Highest education level: Not on file  Occupational History   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    Social Determinants of Health   Financial Resource Strain: Low Risk  (06/20/2021)  Overall Financial Resource Strain (CARDIA)    Difficulty of Paying Living Expenses: Not hard at all  Food Insecurity: No Food Insecurity (06/20/2021)   Hunger Vital Sign    Worried About Running Out of Food in the Last Year: Never true    Ran Out of Food in the Last Year: Never true  Transportation Needs: No Transportation Needs (06/20/2021)   PRAPARE - Hydrologist (Medical): No    Lack of Transportation (Non-Medical): No  Physical Activity: Sufficiently Active (06/20/2021)   Exercise Vital Sign    Days of Exercise per Week: 3 days    Minutes of Exercise per Session: 60 min  Stress: Stress Concern Present (06/20/2021)   [place]    Feeling of Stress : To some extent  Social Connections: Moderately Integrated (06/20/2021)   Social Connection and Isolation  Panel [NHANES]    Frequency of Communication with Friends and Family: More than three times a week    Frequency of Social Gatherings with Friends and Family: More than three times a week    Attends Religious Services: More than 4 times per year    Active Member of [employer] or Organizations: No    Attends Archivist Meetings: Never    Marital Status: Married  Human resources officer Violence: Not At Risk (06/20/2021)   Humiliation, Afraid, Rape, and Kick questionnaire    Fear of Current or Ex-Partner: No    Emotionally Abused: No    Physically Abused: No    Sexually Abused: No    Review of Systems:  All systems reviewed were negative except where noted in HPI.   Physical Exam: General:  Alert, well-developed, in NAD Head:  Normocephalic and atraumatic. Eyes:  Sclera clear, no icterus.   Conjunctiva pink. Ears:  Normal auditory acuity. Mouth:  No deformity or lesions.  Neck:  Supple; no masses . Lungs:  Clear throughout to auscultation.   No wheezes, crackles, or rhonchi. No acute distress. Heart:  Regular rate and rhythm; no murmurs. Abdomen:  Soft, nondistended, nontender. No masses, hepatomegaly. No obvious masses.  Normal bowel .    Rectal:  Deferred   Msk:  Symmetrical without gross deformities.. Pulses:  Normal pulses noted. Extremities:  Without edema. Neurologic:  Alert and  oriented x4;  grossly normal neurologically. Skin:  Intact without significant lesions or rashes. Cervical Nodes:  No significant cervical adenopathy. Psych:  Alert and cooperative. Normal mood and affect.   Impression / Plan:   CRC screening, average risk, for colonoscopy.  Pricilla Riffle. Fuller Plan  09/02/2022, 8:21 AM See Shea Evans, [facility], to contact our on call provider

## 2022-09-02 NOTE — Progress Notes (Signed)
To pacu, VSS. Report to [email or web address] 

## 2022-09-03 ENCOUNTER — Telehealth: Payer: Self-pay | Admitting: *Deleted

## 2022-09-03 NOTE — Telephone Encounter (Signed)
  Follow up Call-     09/02/2022    7:36 AM  Call back number  Post procedure Call Back phone  # [phone]  Permission to leave phone message Yes     Patient questions:  Do you have a fever, pain , or abdominal swelling? No. Pain Score  0 *  Have you tolerated food without any problems? Yes.  Have you been able to return to your normal activities? Yes.  Do you have any questions about your discharge instructions: Diet   No. Medications  No. Follow up visit  No.  Do you have questions or concerns about your Care? No.  Actions: * If pain score is 4 or above: No action needed, pain <4.

## 2022-09-16 ENCOUNTER — Encounter: Payer: Self-pay | Admitting: Gastroenterology

## 2022-09-17 NOTE — Progress Notes (Signed)
Name: Christina Bradley   MRN: [id number]    DOB: 1961-01-20   Date:09/18/2022       Progress Note  Subjective  Chief Complaint  Annual Exam  HPI  Patient presents for annual CPE and follow up   Palpitation: she was seen by Dr. Ubaldo Glassing, at the time she was under a lot of stress. He advised her to take Duloxetine daily .    GAD: she taking Duloxetine daily now , states anxiety is under control . She states has difficulty falling asleep, but when she sleeps about 5 hours she wakes up feeling rested    IBS and ulcerative colitis: sees GI - Dr. Fuller Plan but takes medications only prn. No recent episodes of abdominal pain,hematochezia or blood in stools. Recent colonoscopy showed 2 polyps  GERD: taking Aciphex given by GI, she recently started it, she was waking up with regurgitation. No indigestion or heartburn.    Pre-diabetes: last A1C was 6.3 % she denies polyphagia, polydipsia or polyuria. She has gained weight again since last year   Diet: she states her diet has not been very good lately.  Exercise: discussed 150 minutes per week.   Last Eye Exam: over one year ago  Last Dental Exam:due for a follow up  [place] Visit from [facility]/18/2022 in [facility]  AUDIT-C Score 0      Depression: Phq 9 is  negative    09/18/2022    7:37 AM 06/20/2021    2:51 PM 01/01/2021    3:23 PM 02/02/2019   10:15 AM 12/13/2017    1:30 PM  Depression screen PHQ 2/9  Decreased Interest 0 0 0 0 0  Down, Depressed, Hopeless 0 0 0 0 0  PHQ - 2 Score 0 0 0 0 0  Altered sleeping 0   1   Tired, decreased energy 0   1   Change in appetite 0   0   Feeling bad or failure about yourself  0   0   Trouble concentrating 0   0   Moving slowly or fidgety/restless 0   0   Suicidal thoughts 0   0   PHQ-9 Score 0   2   Difficult doing work/chores    Not difficult at all    Hypertension: BP Readings from Last 3 Encounters:  09/18/22 132/78  09/02/22 135/66  06/04/22 (!) 154/84    Obesity: Wt Readings from Last 3 Encounters:  09/18/22 163 lb (73.9 kg)  09/02/22 164 lb (74.4 kg)  06/04/22 164 lb 2 oz (74.4 kg)   BMI Readings from Last 3 Encounters:  09/18/22 26.31 kg/m  09/02/22 27.29 kg/m  06/04/22 26.69 kg/m     Vaccines:   HPV: N/A Tdap: up to date Shingrix: discussed vaccine  Pneumonia: N/A Flu: due - but will get it at work  COVID-19: not interested    Hep C Screening: today  STD testing and prevention (HIV/chl/gon/syphilis): N/A Intimate partner violence: negative screen  Sexual History : no pain  Menstrual History/LMP/Abnormal Bleeding: post-menopausal Discussed importance of follow up if any post-menopausal bleeding: yes  Incontinence Symptoms: negative for symptoms   Breast cancer:  - Last Mammogram: 05/14/22 - BRCA gene screening: N/A  Osteoporosis Prevention : Discussed high calcium and vitamin D supplementation, weight bearing exercises Bone density: N/A  Cervical cancer screening: 06/20/21  Skin cancer: Discussed monitoring for atypical lesions  Colorectal cancer: 09/02/22   Lung cancer:  Low Dose CT Chest recommended if Age  50-80 years, 20 pack-year currently smoking OR have quit w/in 15years. Patient does not qualify for screen   ECG: 01/22  Advanced Care Planning: A voluntary discussion about advance care planning including the explanation and discussion of advance directives.  Discussed health care proxy and Living will, and the patient was able to identify a health care proxy as daughter.  Patient has a living will and power of attorney of health care   Lipids: Lab Results  Component Value Date   CHOL 246 (H) 02/02/2019   CHOL 208 (H) 12/16/2017   Lab Results  Component Value Date   HDL 43 (L) 02/02/2019   HDL 50 (L) 12/16/2017   Lab Results  Component Value Date   LDLCALC 166 (H) 02/02/2019   LDLCALC 140 (H) 12/16/2017   Lab Results  Component Value Date   TRIG 209 (H) 02/02/2019   TRIG 82 12/16/2017   Lab  Results  Component Value Date   CHOLHDL 5.7 (H) 02/02/2019   CHOLHDL 4.2 12/16/2017   No results found for: "LDLDIRECT"  Glucose: Glucose, Bld  Date Value Ref Range Status  02/02/2019 82 65 - 99 mg/dL Final    Comment:    .            Fasting reference interval .   12/16/2017 105 (H) 65 - 99 mg/dL Final    Comment:    .            Fasting reference interval . For someone without known diabetes, a glucose value between 100 and 125 mg/dL is consistent with prediabetes and should be confirmed with a follow-up test. .     Patient Active Problem List   Diagnosis Date Noted   Palpitations 12/31/2020   White coat hypertension 11/29/2015   GAD (generalized anxiety disorder) 11/29/2015   GERD without esophagitis 11/29/2015   IBS (irritable bowel syndrome) 09/07/2008   Irritable bowel syndrome without diarrhea 09/07/2008   Ulcerative colitis ([place]) 09/06/2008    Past Surgical History:  Procedure Laterality Date   NASAL ENDOSCOPY W/ BALLON SINUPLASTY     NASAL SINUS SURGERY  2015    Family History  Problem Relation Age of Onset   Hyperlipidemia Mother    COPD Father    Allergies Son    Colon cancer Neg Hx    Stomach cancer Neg Hx    Esophageal cancer Neg Hx    Pancreatic cancer Neg Hx    Liver disease Neg Hx    Rectal cancer Neg Hx     Social History   Socioeconomic History   Marital status: Married    Spouse name: Joe   Number of children: 2   Years of education: Not on file   Highest education level: Not on file  Occupational History   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    Social Determinants of Health   Financial Resource Strain: Low Risk  (09/18/2022)   Overall Financial Resource Strain (CARDIA)  Difficulty of Paying Living Expenses: Not hard at all  Food Insecurity: No Food Insecurity (09/18/2022)   Hunger Vital Sign    Worried About Running Out of Food in the Last Year: Never true    Ran Out of Food in the Last Year: Never true  Transportation Needs: No Transportation Needs (09/18/2022)   PRAPARE - Hydrologist (Medical): No    Lack of Transportation (Non-Medical): No  Physical Activity: Inactive (09/18/2022)   Exercise Vital Sign    Days of Exercise per Week: 0 days    Minutes of Exercise per Session: 0 min  Stress: No Stress Concern Present (09/18/2022)   [place]    Feeling of Stress : Only a little  Social Connections: Moderately Integrated (09/18/2022)   Social Connection and Isolation Panel [NHANES]    Frequency of Communication with Friends and Family: More than three times a week    Frequency of Social Gatherings with Friends and Family: More than three times a week    Attends Religious Services: More than 4 times per year    Active Member of [employer] or Organizations: No    Attends Archivist Meetings: Never    Marital Status: Married  Human resources officer Violence: Not At Risk (09/18/2022)   Humiliation, Afraid, Rape, and Kick questionnaire    Fear of Current or Ex-Partner: No    Emotionally Abused: No    Physically Abused: No    Sexually Abused: No     Current Outpatient Medications:    cholecalciferol (VITAMIN D3) 25 MCG (1000 UNIT) tablet, Take 1,000-2,000 Units by mouth as needed., Disp: , Rfl:    glycopyrrolate (ROBINUL) 1 MG tablet, Take 1 tablet (1 mg total) by mouth 2 (two) times daily as needed., Disp: 60 tablet, Rfl: 3   RABEprazole (ACIPHEX) 20 MG  tablet, Take 1 tablet (20 mg total) by mouth daily., Disp: 30 tablet, Rfl: 11   DULoxetine (CYMBALTA) 60 MG capsule, Take 1 capsule (60 mg total) by mouth daily., Disp: 90 capsule, Rfl: 3  No Known Allergies   ROS  Constitutional: Negative for fever, positive  weight change.  Respiratory: Negative for cough and shortness of breath.   Cardiovascular: Negative for chest pain or palpitations.  Gastrointestinal: Negative for abdominal pain, no bowel changes.  Musculoskeletal: Negative for gait problem or joint swelling.  Skin: Negative for rash.  Neurological: Negative for dizziness or headache.  No other specific complaints in a complete review of systems (except as listed in HPI above).   Objective  Vitals:   09/18/22 0738  BP: 132/78  Pulse: 96  Resp: 16  SpO2: 96%  Weight: 163 lb (73.9 kg)  Height: 5' 6" (1.676 m)    Body mass index is 26.31 kg/m.  Physical Exam  Constitutional: Patient appears well-developed and well-nourished. No distress.  HENT: Head: Normocephalic and atraumatic. Ears: B TMs ok, no erythema or effusion; Nose: Nose normal. Mouth/Throat: Oropharynx is clear and moist. No oropharyngeal exudate.  Eyes: Conjunctivae and EOM are normal. Pupils are equal, round, and reactive to light. No scleral icterus.  Neck: Normal range of motion. Neck supple. No JVD present. No thyromegaly present.  Cardiovascular: Normal rate, regular rhythm and normal heart sounds.  No murmur heard. No BLE edema. Pulmonary/Chest: Effort normal and breath sounds normal. No respiratory distress. Abdominal: Soft. Bowel sounds are normal, no distension. There is no tenderness. no masses Breast: no lumps or masses, no  nipple discharge or rashes FEMALE GENITALIA:  Not done RECTAL: not done  Musculoskeletal: Normal range of motion, no joint effusions. No gross deformities Neurological: he is alert and oriented to person, place, and time. No cranial nerve deficit. Coordination, balance,  strength, speech and gait are normal.  Skin: Skin is warm and dry. No rash noted. No erythema.  Psychiatric: Patient has a normal mood and affect. behavior is normal. Judgment and thought content normal.   Fall Risk:    09/18/2022    7:37 AM 06/20/2021    2:51 PM 01/01/2021    3:23 PM 02/02/2019   10:15 AM 12/13/2017    1:48 PM  Fall Risk   Falls in the past year? 0 0 0 0 Yes  Comment     fell off a ladder June 2018   Number falls in past yr: 0 0 0 0 1  Injury with Fall? 0 0 0 0 Yes  Risk for fall due to : No Fall Risks    History of fall(s)  Follow up Falls prevention discussed   Falls evaluation completed Education provided     Functional Status Survey: Is the patient deaf or have difficulty hearing?: No Does the patient have difficulty seeing, even when wearing glasses/contacts?: No Does the patient have difficulty concentrating, remembering, or making decisions?: No Does the patient have difficulty walking or climbing stairs?: No Does the patient have difficulty dressing or bathing?: No Does the patient have difficulty doing errands alone such as visiting a doctor's office or shopping?: No   Assessment & Plan  1. Well adult exam  - Lipid panel - COMPLETE METABOLIC PANEL WITH GFR - CBC with Differential/Platelet - Hemoglobin A1c - Hepatitis C antibody  2. Other ulcerative colitis without complication (HCC)  - CBC with Differential/Platelet  3. Dyslipidemia  - Lipid panel  4. Pre-diabetes  - Hemoglobin A1c  5. Long-term use of high-risk medication  - COMPLETE METABOLIC PANEL WITH GFR - CBC with Differential/Platelet  6. GAD (generalized anxiety disorder)  - DULoxetine (CYMBALTA) 60 MG capsule; Take 1 capsule (60 mg total) by mouth daily.  Dispense: 90 capsule; Refill: 3  7. Need for hepatitis C screening test  - Hepatitis C antibody  8. Irritable bowel syndrome, unspecified type  - DULoxetine (CYMBALTA) 60 MG capsule; Take 1 capsule (60 mg total) by  mouth daily.  Dispense: 90 capsule; Refill: 3    -USPSTF grade A and B recommendations reviewed with patient; age-appropriate recommendations, preventive care, screening tests, etc discussed and encouraged; healthy living encouraged; see AVS for patient education given to patient -Discussed importance of 150 minutes of physical activity weekly, eat two servings of fish weekly, eat one serving of tree nuts ( cashews, pistachios, pecans, almonds.Marland Kitchen) every other day, eat 6 servings of fruit/vegetables daily and drink plenty of water and avoid sweet beverages.   -Reviewed Health Maintenance: Yes.

## 2022-09-17 NOTE — Patient Instructions (Signed)
Preventive Care 40-60 Years Old, Female Preventive care refers to lifestyle choices and visits with your health care provider that can promote health and wellness. Preventive care visits are also called wellness exams. What can I expect for my preventive care visit? Counseling Your health care provider may ask you questions about your: Medical history, including: Past medical problems. Family medical history. Pregnancy history. Current health, including: Menstrual cycle. Method of birth control. Emotional well-being. Home life and relationship well-being. Sexual activity and sexual health. Lifestyle, including: Alcohol, nicotine or tobacco, and drug use. Access to firearms. Diet, exercise, and sleep habits. Work and work environment. Sunscreen use. Safety issues such as seatbelt and bike helmet use. Physical exam Your health care provider will check your: Height and weight. These may be used to calculate your BMI (body mass index). BMI is a measurement that tells if you are at a healthy weight. Waist circumference. This measures the distance around your waistline. This measurement also tells if you are at a healthy weight and may help predict your risk of certain diseases, such as type 2 diabetes and high blood pressure. Heart rate and blood pressure. Body temperature. Skin for abnormal spots. What immunizations do I need?  Vaccines are usually given at various ages, according to a schedule. Your health care provider will recommend vaccines for you based on your age, medical history, and lifestyle or other factors, such as travel or where you work. What tests do I need? Screening Your health care provider may recommend screening tests for certain conditions. This may include: Lipid and cholesterol levels. Diabetes screening. This is done by checking your blood sugar (glucose) after you have not eaten for a while (fasting). Pelvic exam and Pap test. Hepatitis B test. Hepatitis C  test. HIV (human immunodeficiency virus) test. STI (sexually transmitted infection) testing, if you are at risk. Lung cancer screening. Colorectal cancer screening. Mammogram. Talk with your health care provider about when you should start having regular mammograms. This may depend on whether you have a family history of breast cancer. BRCA-related cancer screening. This may be done if you have a family history of breast, ovarian, tubal, or peritoneal cancers. Bone density scan. This is done to screen for osteoporosis. Talk with your health care provider about your test results, treatment options, and if necessary, the need for more tests. Follow these instructions at home: Eating and drinking  Eat a diet that includes fresh fruits and vegetables, whole grains, lean protein, and low-fat dairy products. Take vitamin and mineral supplements as recommended by your health care provider. Do not drink alcohol if: Your health care provider tells you not to drink. You are pregnant, may be pregnant, or are planning to become pregnant. If you drink alcohol: Limit how much you have to 0-1 drink a day. Know how much alcohol is in your drink. In the [place] one drink equals one 12 oz bottle of beer (355 mL), one 5 oz glass of wine (148 mL), or one 1 oz glass of hard liquor (44 mL). Lifestyle Brush your teeth every morning and night with fluoride toothpaste. Floss one time each day. Exercise for at least 30 minutes 5 or more days each week. Do not use any products that contain nicotine or tobacco. These products include cigarettes, chewing tobacco, and vaping devices, such as e-cigarettes. If you need help quitting, ask your health care provider. Do not use drugs. If you are sexually active, practice safe sex. Use a condom or other form of protection to   prevent STIs. If you do not wish to become pregnant, use a form of birth control. If you plan to become pregnant, see your health care provider for a  prepregnancy visit. Take aspirin only as told by your health care provider. Make sure that you understand how much to take and what form to take. Work with your health care provider to find out whether it is safe and beneficial for you to take aspirin daily. Find healthy ways to manage stress, such as: Meditation, yoga, or listening to music. Journaling. Talking to a trusted person. Spending time with friends and family. Minimize exposure to UV radiation to reduce your risk of skin cancer. Safety Always wear your seat belt while driving or riding in a vehicle. Do not drive: If you have been drinking alcohol. Do not ride with someone who has been drinking. When you are tired or distracted. While texting. If you have been using any mind-altering substances or drugs. Wear a helmet and other protective equipment during sports activities. If you have firearms in your house, make sure you follow all gun safety procedures. Seek help if you have been physically or sexually abused. What's next? Visit your health care provider once a year for an annual wellness visit. Ask your health care provider how often you should have your eyes and teeth checked. Stay up to date on all vaccines. This information is not intended to replace advice given to you by your health care provider. Make sure you discuss any questions you have with your health care provider. Document Revised: 05/21/2021 Document Reviewed: 05/21/2021 Elsevier Patient Education  [place].

## 2022-09-18 ENCOUNTER — Encounter: Payer: Self-pay | Admitting: Family Medicine

## 2022-09-18 ENCOUNTER — Ambulatory Visit (INDEPENDENT_AMBULATORY_CARE_PROVIDER_SITE_OTHER): Payer: Managed Care, Other (non HMO) | Admitting: Family Medicine

## 2022-09-18 VITALS — BP 132/78 | HR 96 | Resp 16 | Ht 66.0 in | Wt 163.0 lb

## 2022-09-18 DIAGNOSIS — Z79899 Other long term (current) drug therapy: Secondary | ICD-10-CM | POA: Diagnosis not present

## 2022-09-18 DIAGNOSIS — K5180 Other ulcerative colitis without complications: Secondary | ICD-10-CM | POA: Diagnosis not present

## 2022-09-18 DIAGNOSIS — R7303 Prediabetes: Secondary | ICD-10-CM | POA: Diagnosis not present

## 2022-09-18 DIAGNOSIS — Z1159 Encounter for screening for other viral diseases: Secondary | ICD-10-CM | POA: Diagnosis not present

## 2022-09-18 DIAGNOSIS — Z0000 Encounter for general adult medical examination without abnormal findings: Secondary | ICD-10-CM | POA: Diagnosis not present

## 2022-09-18 DIAGNOSIS — F411 Generalized anxiety disorder: Secondary | ICD-10-CM

## 2022-09-18 DIAGNOSIS — K589 Irritable bowel syndrome without diarrhea: Secondary | ICD-10-CM

## 2022-09-18 DIAGNOSIS — E785 Hyperlipidemia, unspecified: Secondary | ICD-10-CM

## 2022-09-18 MED ORDER — DULOXETINE HCL 60 MG PO CPEP
60.0000 mg | ORAL_CAPSULE | Freq: Every day | ORAL | 3 refills | Status: DC
  Filled 2023-08-17: qty 30, 30d supply, fill #0

## 2022-09-21 LAB — CBC WITH DIFFERENTIAL/PLATELET
Absolute Monocytes: 497 cells/uL (ref 200–950)
Basophils Absolute: 62 cells/uL (ref 0–200)
Basophils Relative: 0.9 %
Eosinophils Absolute: 173 cells/uL (ref 15–500)
Eosinophils Relative: 2.5 %
HCT: 45.1 % — ABNORMAL HIGH (ref 35.0–45.0)
Hemoglobin: 15.1 g/dL (ref 11.7–15.5)
Lymphs Abs: 1939 cells/uL (ref 850–3900)
MCH: 30.6 pg (ref 27.0–33.0)
MCHC: 33.5 g/dL (ref 32.0–36.0)
MCV: 91.3 fL (ref 80.0–100.0)
MPV: 11.1 fL (ref 7.5–12.5)
Monocytes Relative: 7.2 %
Neutro Abs: 4230 cells/uL (ref 1500–7800)
Neutrophils Relative %: 61.3 %
Platelets: 267 10*3/uL (ref 140–400)
RBC: 4.94 10*6/uL (ref 3.80–5.10)
RDW: 13.1 % (ref 11.0–15.0)
Total Lymphocyte: 28.1 %
WBC: 6.9 10*3/uL (ref 3.8–10.8)

## 2022-09-21 LAB — HEMOGLOBIN A1C
Hgb A1c MFr Bld: 7.4 % of total Hgb — ABNORMAL HIGH (ref <5.7)
Mean Plasma Glucose: 166 mg/dL
eAG (mmol/L): 9.2 mmol/L

## 2022-09-21 LAB — COMPLETE METABOLIC PANEL WITH GFR
AG Ratio: 1.7 (calc) (ref 1.0–2.5)
ALT: 22 U/L (ref 6–29)
AST: 22 U/L (ref 10–35)
Albumin: 4.4 g/dL (ref 3.6–5.1)
Alkaline phosphatase (APISO): 54 U/L (ref 37–153)
BUN: 14 mg/dL (ref 7–25)
CO2: 30 mmol/L (ref 20–32)
Calcium: 9.7 mg/dL (ref 8.6–10.4)
Chloride: 104 mmol/L (ref 98–110)
Creat: 0.79 mg/dL (ref 0.50–1.05)
Globulin: 2.6 g/dL (calc) (ref 1.9–3.7)
Glucose, Bld: 151 mg/dL — ABNORMAL HIGH (ref 65–99)
Potassium: 4.4 mmol/L (ref 3.5–5.3)
Sodium: 143 mmol/L (ref 135–146)
Total Bilirubin: 0.4 mg/dL (ref 0.2–1.2)
Total Protein: 7 g/dL (ref 6.1–8.1)
eGFR: 86 mL/min/{1.73_m2} (ref >=60)

## 2022-09-21 LAB — LIPID PANEL
Cholesterol: 299 mg/dL — ABNORMAL HIGH (ref <200)
HDL: 47 mg/dL — ABNORMAL LOW (ref >=50)
LDL Cholesterol (Calc): 208 mg/dL (calc) — ABNORMAL HIGH
Non-HDL Cholesterol (Calc): 252 mg/dL (calc) — ABNORMAL HIGH (ref <130)
Total CHOL/HDL Ratio: 6.4 (calc) — ABNORMAL HIGH (ref <5.0)
Triglycerides: 236 mg/dL — ABNORMAL HIGH (ref <150)

## 2022-09-21 LAB — HEPATITIS C ANTIBODY: Hepatitis C Ab: NONREACTIVE

## 2022-09-22 NOTE — Progress Notes (Unsigned)
Name: Christina Bradley   MRN: [id number]    DOB: 06-24-1961   Date:09/23/2022       Progress Note  Subjective  Chief Complaint  Discuss DM  HPI  New Onset DM: A1C was 7.4 %, she came in today to discuss new onset diabetes , answered questions. Discussed diet and exercise . Gave her a list of foods to avoid and what is low in carbs. Discussed medications, we will avoid Metformin since she has a personal history of ulcerative colitis and also IBS diarrhea. She denies personal history of pancreatitis or family history of thyroid cancer. Discussed possible side effects. Importance of yearly eye exam, checking urine micro, diabetic foot exam . She also agrees to start statin therapy for dyslipidemia. LDL over 200  Discussed vaccines: Pneumonia, RSV   Patient Active Problem List   Diagnosis Date Noted   Dyslipidemia associated with type 2 diabetes mellitus ([place]) 09/23/2022   Palpitations 12/31/2020   White coat hypertension 11/29/2015   GAD (generalized anxiety disorder) 11/29/2015   GERD without esophagitis 11/29/2015   IBS (irritable bowel syndrome) 09/07/2008   Irritable bowel syndrome without diarrhea 09/07/2008   Ulcerative colitis ([place]) 09/06/2008    Past Surgical History:  Procedure Laterality Date   NASAL ENDOSCOPY W/ BALLON SINUPLASTY     NASAL SINUS SURGERY  2015    Family History  Problem Relation Age of Onset   Hyperlipidemia Mother    COPD Father    Allergies Son    Colon cancer Neg Hx    Stomach cancer Neg Hx    Esophageal cancer Neg Hx    Pancreatic cancer Neg Hx    Liver disease Neg Hx    Rectal cancer Neg Hx     Social History   Tobacco Use   Smoking status: Never   Smokeless tobacco: Never  Substance Use Topics   Alcohol use: Not Currently     Current Outpatient Medications:    cholecalciferol (VITAMIN D3) 25 MCG (1000 UNIT) tablet, Take 1,000-2,000 Units by mouth as needed., Disp: , Rfl:    DULoxetine (CYMBALTA) 60 MG capsule, Take 1 capsule (60 mg  total) by mouth daily., Disp: 90 capsule, Rfl: 3   glycopyrrolate (ROBINUL) 1 MG tablet, Take 1 tablet (1 mg total) by mouth 2 (two) times daily as needed., Disp: 60 tablet, Rfl: 3   RABEprazole (ACIPHEX) 20 MG tablet, Take 1 tablet (20 mg total) by mouth daily., Disp: 30 tablet, Rfl: 11   rosuvastatin (CRESTOR) 10 MG tablet, Take 1 tablet (10 mg total) by mouth daily., Disp: 90 tablet, Rfl: 3   Semaglutide (RYBELSUS) 7 MG TABS, Take 7 mg by mouth daily., Disp: 90 tablet, Rfl: 0  No Known Allergies  I personally reviewed active problem list, medication list, allergies, family history, social history, health maintenance with the patient/caregiver today.   ROS  Constitutional: Negative for fever or weight change.  Respiratory: Negative for cough and shortness of breath.   Cardiovascular: Negative for chest pain or palpitations.  Gastrointestinal: Negative for abdominal pain, no bowel changes.  Musculoskeletal: Negative for gait problem or joint swelling.  Skin: Negative for rash.  Neurological: Negative for dizziness or headache.  No other specific complaints in a complete review of systems (except as listed in HPI above).   Objective  Vitals:   09/23/22 1414  BP: 122/74  Pulse: (!) 104  Resp: 16  SpO2: 99%  Weight: 163 lb (73.9 kg)  Height: _0  (1.676 m)  Body mass index is 26.31 kg/m.  Physical Exam  Constitutional: Patient appears well-developed and well-nourished. No distress.  HEENT: head atraumatic, normocephalic, pupils equal and reactive to light, neck supple Cardiovascular: Normal rate, regular rhythm and normal heart sounds.  No murmur heard. No BLE edema. Pulmonary/Chest: Effort normal and breath sounds normal. No respiratory distress. Abdominal: Soft.  There is no tenderness. Psychiatric: Patient has a normal mood and affect. behavior is normal. Judgment and thought content normal.   Recent Results (from the past 2160 hour(s))  Lipid panel     Status:  Abnormal   Collection Time: 09/18/22  8:26 AM  Result Value Ref Range   Cholesterol 299 (H) <200 mg/dL   HDL 47 (L) > OR = 50 mg/dL   Triglycerides 236 (H) <150 mg/dL    Comment: . If a non-fasting specimen was collected, consider repeat triglyceride testing on a fasting specimen if clinically indicated.  Yates Decamp et al. J. of Clin. Lipidol. 3235;5:732-202. Marland Kitchen    LDL Cholesterol (Calc) 208 (H) mg/dL (calc)    Comment: LDL-C levels > or = 190 mg/dL may indicate familial  hypercholesterolemia (FH). Clinical assessment and  measurement of blood lipid levels should be  considered for all first degree relatives of patients with an FH diagnosis. LDL Cholesterol (LDL-C) levels > or = 300 mg/dL may indicate homozygous familial hypercholesterolemia (HoFH). Untreated,  these extremely high LDL-C levels can result in premature CV events and mortality. Patients should be identified early and provided appropriate interventions to reduce the cumulative LDL-C burden from birth. . For questions about testing for familial hypercholesterolemia, please call Insurance risk surveyor at [phone].GENE.INFO. Duncan Dull, et al. J National Lipid Association  Recommendations for Patient-Centered Management of Dyslipidemia: Part 1 Journal of  Clinical Lipidology 2015;9(2), 129-169. Cuchel, M. et al. (2014). Homozygous familial hypercholesterolaemia: new insights and guidance for clinicians to impro ve detection and clinical management. European Heart Journal, 35(32), 6704777069. Reference range: <100 . Desirable range <100 mg/dL for primary prevention;   <70 mg/dL for patients with CHD or diabetic patients  with > or = 2 CHD risk factors. Marland Kitchen LDL-C is now calculated using the Martin-Hopkins  calculation, which is a validated novel method providing  better accuracy than the Friedewald equation in the  estimation of LDL-C.  Cresenciano Genre et al. Annamaria Helling. 7628;315(17): 2061-2068   (http://education.[facility].com/faq/FAQ164)    Total CHOL/HDL Ratio 6.4 (H) <5.0 (calc)   Non-HDL Cholesterol (Calc) 252 (H) <130 mg/dL (calc)    Comment: Non-HDL level > or = 220 is very high and may indicate  genetic familial hypercholesterolemia (FH). Clinical  assessment and measurement of blood lipid levels  should be considered for all first-degree relatives  of patients with an FH diagnosis. . For patients with diabetes plus 1 major ASCVD risk  factor, treating to a non-HDL-C goal of <100 mg/dL  (LDL-C of <70 mg/dL) is considered a therapeutic  option.   COMPLETE METABOLIC PANEL WITH GFR     Status: Abnormal   Collection Time: 09/18/22  8:26 AM  Result Value Ref Range   Glucose, Bld 151 (H) 65 - 99 mg/dL    Comment: .            Fasting reference interval . For someone without known diabetes, a glucose value >125 mg/dL indicates that they may have diabetes and this should be confirmed with a follow-up test. .    BUN 14 7 - 25 mg/dL   Creat 0.79 0.50 - 1.05 mg/dL  eGFR 86 > OR = 60 mL/min/1.14m   BUN/Creatinine Ratio SEE NOTE: 6 - 22 (calc)    Comment:    Not Reported: BUN and Creatinine are within    reference range. .    Sodium 143 135 - 146 mmol/L   Potassium 4.4 3.5 - 5.3 mmol/L   Chloride 104 98 - 110 mmol/L   CO2 30 20 - 32 mmol/L   Calcium 9.7 8.6 - 10.4 mg/dL   Total Protein 7.0 6.1 - 8.1 g/dL   Albumin 4.4 3.6 - 5.1 g/dL   Globulin 2.6 1.9 - 3.7 g/dL (calc)   AG Ratio 1.7 1.0 - 2.5 (calc)   Total Bilirubin 0.4 0.2 - 1.2 mg/dL   Alkaline phosphatase (APISO) 54 37 - 153 U/L   AST 22 10 - 35 U/L   ALT 22 6 - 29 U/L  CBC with Differential/Platelet     Status: Abnormal   Collection Time: 09/18/22  8:26 AM  Result Value Ref Range   WBC 6.9 3.8 - 10.8 Thousand/uL   RBC 4.94 3.80 - 5.10 Million/uL   Hemoglobin 15.1 11.7 - 15.5 g/dL   HCT 45.1 (H) 35.0 - 45.0 %   MCV 91.3 80.0 - 100.0 fL   MCH 30.6 27.0 - 33.0 pg   MCHC 33.5 32.0 - 36.0 g/dL    RDW 13.1 11.0 - 15.0 %   Platelets 267 140 - 400 Thousand/uL   MPV 11.1 7.5 - 12.5 fL   Neutro Abs 4,230 1,500 - 7,800 cells/uL   Lymphs Abs 1,939 850 - 3,900 cells/uL   Absolute Monocytes 497 200 - 950 cells/uL   Eosinophils Absolute 173 15 - 500 cells/uL   Basophils Absolute 62 0 - 200 cells/uL   Neutrophils Relative % 61.3 %   Total Lymphocyte 28.1 %   Monocytes Relative 7.2 %   Eosinophils Relative 2.5 %   Basophils Relative 0.9 %  Hemoglobin A1c     Status: Abnormal   Collection Time: 09/18/22  8:26 AM  Result Value Ref Range   Hgb A1c MFr Bld 7.4 (H) <5.7 % of total Hgb    Comment: For someone without known diabetes, a hemoglobin A1c value of 6.5% or greater indicates that they may have  diabetes and this should be confirmed with a follow-up  test. . For someone with known diabetes, a value <7% indicates  that their diabetes is well controlled and a value  greater than or equal to 7% indicates suboptimal  control. A1c targets should be individualized based on  duration of diabetes, age, comorbid conditions, and  other considerations. . Currently, no consensus exists regarding use of hemoglobin A1c for diagnosis of diabetes for children. .    Mean Plasma Glucose 166 mg/dL   eAG (mmol/L) 9.2 mmol/L  Hepatitis C antibody     Status: None   Collection Time: 09/18/22  8:26 AM  Result Value Ref Range   Hepatitis C Ab NON-REACTIVE NON-REACTIVE    Comment: . HCV antibody was non-reactive. There is no laboratory  evidence of HCV infection. . In most cases, no further action is required. However, if recent HCV exposure is suspected, a test for HCV RNA (test code [phone] is suggested. . For additional information please refer to http://education.[facility].com/faq/FAQ22v1 (This link is being provided for informational/ educational purposes only.) .     Diabetic Foot Exam: Diabetic Foot Exam - Simple   Simple Foot Form Visual Inspection No deformities, no  ulcerations, no other skin breakdown bilaterally:  Yes Sensation Testing Intact to touch and monofilament testing bilaterally: Yes Pulse Check Posterior Tibialis and Dorsalis pulse intact bilaterally: Yes Comments      PHQ2/9:    09/23/2022    2:14 PM 09/18/2022    7:37 AM 06/20/2021    2:51 PM 01/01/2021    3:23 PM 02/02/2019   10:15 AM  Depression screen PHQ 2/9  Decreased Interest 0 0 0 0 0  Down, Depressed, Hopeless 0 0 0 0 0  PHQ - 2 Score 0 0 0 0 0  Altered sleeping 0 0   1  Tired, decreased energy 0 0   1  Change in appetite 0 0   0  Feeling bad or failure about yourself  0 0   0  Trouble concentrating 0 0   0  Moving slowly or fidgety/restless 0 0   0  Suicidal thoughts 0 0   0  PHQ-9 Score 0 0   2  Difficult doing work/chores     Not difficult at all    phq 9 is negative   Fall Risk:    09/23/2022    2:14 PM 09/18/2022    7:37 AM 06/20/2021    2:51 PM 01/01/2021    3:23 PM 02/02/2019   10:15 AM  Fall Risk   Falls in the past year? 0 0 0 0 0  Number falls in past yr: 0 0 0 0 0  Injury with Fall? 0 0 0 0 0  Risk for fall due to : No Fall Risks No Fall Risks     Follow up Falls prevention discussed Falls prevention discussed   Falls evaluation completed      Functional Status Survey: Is the patient deaf or have difficulty hearing?: No Does the patient have difficulty seeing, even when wearing glasses/contacts?: No Does the patient have difficulty concentrating, remembering, or making decisions?: No Does the patient have difficulty walking or climbing stairs?: No Does the patient have difficulty dressing or bathing?: No Does the patient have difficulty doing errands alone such as visiting a doctor's office or shopping?: No    Assessment & Plan  1. New onset type 2 diabetes mellitus (HCC)  - Urine Microalbumin w/creat. ratio - Semaglutide (RYBELSUS) 7 MG TABS; Take 7 mg by mouth daily.  Dispense: 90 tablet; Refill: 0 - Amb ref to Medical Nutrition  Therapy-MNT - HgB A1c  2. Dyslipidemia associated with type 2 diabetes mellitus (HCC)  - Semaglutide (RYBELSUS) 7 MG TABS; Take 7 mg by mouth daily.  Dispense: 90 tablet; Refill: 0 - rosuvastatin (CRESTOR) 10 MG tablet; Take 1 tablet (10 mg total) by mouth daily.  Dispense: 90 tablet; Refill: 3 - Lipid panel - Comprehensive metabolic panel  3. Need for pneumococcal 20-valent conjugate vaccination  Refused   4. Need for RSV immunization  Refused   5. Long-term use of high-risk medication  - Comprehensive metabolic panel

## 2022-09-23 ENCOUNTER — Encounter: Payer: Self-pay | Admitting: Family Medicine

## 2022-09-23 ENCOUNTER — Ambulatory Visit: Payer: Managed Care, Other (non HMO) | Admitting: Family Medicine

## 2022-09-23 VITALS — BP 122/74 | HR 104 | Resp 16 | Ht 66.0 in | Wt 163.0 lb

## 2022-09-23 DIAGNOSIS — Z23 Encounter for immunization: Secondary | ICD-10-CM | POA: Diagnosis not present

## 2022-09-23 DIAGNOSIS — Z79899 Other long term (current) drug therapy: Secondary | ICD-10-CM

## 2022-09-23 DIAGNOSIS — E1169 Type 2 diabetes mellitus with other specified complication: Secondary | ICD-10-CM | POA: Diagnosis not present

## 2022-09-23 DIAGNOSIS — E119 Type 2 diabetes mellitus without complications: Secondary | ICD-10-CM | POA: Diagnosis not present

## 2022-09-23 DIAGNOSIS — Z2911 Encounter for prophylactic immunotherapy for respiratory syncytial virus (RSV): Secondary | ICD-10-CM | POA: Diagnosis not present

## 2022-09-23 DIAGNOSIS — E785 Hyperlipidemia, unspecified: Secondary | ICD-10-CM

## 2022-09-23 MED ORDER — RYBELSUS 7 MG PO TABS: 7.0000 mg | ORAL_TABLET | Freq: Every day | ORAL | 0 refills | Status: DC

## 2022-09-23 MED ORDER — ROSUVASTATIN CALCIUM 10 MG PO TABS: 10.0000 mg | ORAL_TABLET | Freq: Every day | ORAL | 3 refills | Status: DC

## 2022-09-24 LAB — MICROALBUMIN / CREATININE URINE RATIO
Creatinine, Urine: 45 mg/dL (ref 20–275)
Microalb, Ur: <0.2 mg/dL

## 2022-09-26 ENCOUNTER — Encounter: Payer: Self-pay | Admitting: Family Medicine

## 2022-10-26 ENCOUNTER — Encounter: Payer: Managed Care, Other (non HMO) | Attending: Family Medicine | Admitting: *Deleted

## 2022-10-26 ENCOUNTER — Encounter: Payer: Self-pay | Admitting: *Deleted

## 2022-10-26 VITALS — BP 140/82 | Ht 67.0 in | Wt 165.1 lb

## 2022-10-26 DIAGNOSIS — E119 Type 2 diabetes mellitus without complications: Secondary | ICD-10-CM | POA: Insufficient documentation

## 2022-10-26 NOTE — Patient Instructions (Signed)
Exercise:  Begin walking for    15  minutes   3  days a week and gradually increase to 30 minutes 5 x week  Eat 3 meals day,   1-2  snacks a day Space meals 4-6 hours apart Don't skip meals - eat 1 protein and 1 carbohydrate serving Eat 1 serving of protein when having fruit for a snack  Make an eye doctor appointment  Call back if you want to schedule Diabetes classes or an appointment with the nurse or dietitian

## 2022-10-26 NOTE — Progress Notes (Signed)
Diabetes Self-Management Education  Visit Type: First/Initial  Appt. Start Time: 0845 Appt. End Time: 3570  10/26/2022  [name], identified by name and date of birth, is a 61 y.o. female with a diagnosis of Diabetes: Type 2.   ASSESSMENT  Blood pressure (!) 140/82, height 5' 7"  (1.702 m), weight 165 lb 1.6 oz (74.9 kg), last menstrual period 11/05/2012. Body mass index is 25.86 kg/m.   Diabetes Self-Management Education - 10/26/22 1011       Visit Information   Visit Type First/Initial      Initial Visit   Diabetes Type Type 2    Date Diagnosed Type 2    Are you currently following a meal plan? No    Are you taking your medications as prescribed? No   Not currently taking Rybelsus. She wants to try with diet and exercise first.     Health Coping   How would you rate your overall health? Good      Psychosocial Assessment   Patient Belief/Attitude about Diabetes Motivated to manage diabetes   "something I have to deal with"   What is the hardest part about your diabetes right now, causing you the most concern, or is the most worrisome to you about your diabetes?   Making healty food and beverage choices    Self-care barriers None    Self-management support Doctor's office;Family    Patient Concerns Nutrition/Meal planning;Glycemic Control;Medication;Monitoring;Weight Control;Healthy Lifestyle    Special Needs None    Preferred Learning Style Auditory;Other (comment)   talking/listening   How often do you need to have someone help you when you read instructions, pamphlets, or other written materials from your doctor or pharmacy? 1 - Never    What is the last grade level you completed in school? 12th      Pre-Education Assessment   Patient understands the diabetes disease and treatment process. Needs Instruction    Patient understands incorporating nutritional management into lifestyle. Needs Instruction    Patient undertands incorporating physical activity into  lifestyle. Needs Instruction    Patient understands using medications safely. Needs Instruction    Patient understands monitoring blood glucose, interpreting and using results Needs Instruction    Patient understands prevention, detection, and treatment of acute complications. Needs Instruction    Patient understands prevention, detection, and treatment of chronic complications. Needs Instruction    Patient understands how to develop strategies to address psychosocial issues. Needs Instruction    Patient understands how to develop strategies to promote health/change behavior. Needs Instruction      Complications   Last HgB A1C per patient/outside source 7.4 %   09/18/2022   How often do you check your blood sugar? Patient declines   BG in the office was 118 mg/dL at 9:45 am - fasting.   Have you had a dilated eye exam in the past 12 months? No    Have you had a dental exam in the past 12 months? No    Are you checking your feet? Yes    How many days per week are you checking your feet? 1      Dietary Intake   Breakfast oatmeal with nuts; cereal and milk; egg, bacon and toast; fruit - strawberries, blueberries, apples, cantaloup    Snack (morning) 1 snack/day - peanuts    Lunch skips or eats nabs or leftovers    Dinner chicken, occasional beef and salmon, rarely pork; potatoes, corn, green beans, rice, pasta, broccoli, zucchini, squash, fruit, lettuce, tomatoes,  carrots, cuccumbers    Beverage(s) water      Activity / Exercise   Activity / Exercise Type ADL's      Patient Education   Previous Diabetes Education No    Disease Pathophysiology Definition of diabetes, type 1 and 2, and the diagnosis of diabetes;Factors that contribute to the development of diabetes;Explored patient's options for treatment of their diabetes    Healthy Eating Role of diet in the treatment of diabetes and the relationship between the three main macronutrients and blood glucose level;Food label reading, portion  sizes and measuring food.    Being Active Role of exercise on diabetes management, blood pressure control and cardiac health.    Medications Reviewed patients medication for diabetes, action, purpose, timing of dose and side effects.   Rybelsus   Monitoring Identified appropriate SMBG and/or A1C goals.    Chronic complications Relationship between chronic complications and blood glucose control    Diabetes Stress and Support Identified and addressed patients feelings and concerns about diabetes      Individualized Goals (developed by patient)   Reducing Risk Other (comment)   improve blood sugars, decrease medications, prevent diabetes compications, lose weight, lead a healthier lifestyle     Outcomes   Expected Outcomes Demonstrated interest in learning. Expect positive outcomes    Program Status Not Completed         Individualized Plan for Diabetes Self-Management Training:   Learning Objective:  Patient will have a greater understanding of diabetes self-management. Patient education plan is to attend individual and/or group sessions per assessed needs and concerns.   Plan:   Patient Instructions  Exercise:  Begin walking for    15  minutes   3  days a week and gradually increase to 30 minutes 5 x week  Eat 3 meals day,   1-2  snacks a day Space meals 4-6 hours apart Don't skip meals - eat 1 protein and 1 carbohydrate serving Eat 1 serving of protein when having fruit for a snack  Make an eye doctor appointment  Call back if you want to schedule Diabetes classes or an appointment with the nurse or dietitian  Expected Outcomes:  Demonstrated interest in learning. Expect positive outcomes  Education material provided:  General Meal Planning Guidelines Simple Meal Plan Healthy Snack Choices (ADA)  If problems or questions, patient to contact team via:   Johny Drilling, RN, [place], [place] [phone]  Future DSME appointment: PRN

## 2022-11-04 ENCOUNTER — Encounter: Payer: Self-pay | Admitting: Gastroenterology

## 2022-12-25 ENCOUNTER — Ambulatory Visit: Payer: Managed Care, Other (non HMO) | Admitting: Family Medicine

## 2023-01-15 ENCOUNTER — Other Ambulatory Visit
Admission: RE | Admit: 2023-01-15 | Discharge: 2023-01-15 | Disposition: A | Discharge status: Home / Self Care | Payer: Managed Care, Other (non HMO) | Attending: Family Medicine | Admitting: Family Medicine

## 2023-01-15 DIAGNOSIS — E1169 Type 2 diabetes mellitus with other specified complication: Secondary | ICD-10-CM | POA: Insufficient documentation

## 2023-01-15 DIAGNOSIS — E119 Type 2 diabetes mellitus without complications: Secondary | ICD-10-CM | POA: Diagnosis not present

## 2023-01-15 DIAGNOSIS — Z79899 Other long term (current) drug therapy: Secondary | ICD-10-CM | POA: Diagnosis not present

## 2023-01-15 DIAGNOSIS — E785 Hyperlipidemia, unspecified: Secondary | ICD-10-CM | POA: Diagnosis not present

## 2023-01-15 LAB — LIPID PANEL
Cholesterol: 141 mg/dL (ref 0–200)
HDL: 47 mg/dL (ref >40)
LDL Cholesterol: 74 mg/dL (ref 0–99)
Total CHOL/HDL Ratio: 3 RATIO
Triglycerides: 99 mg/dL (ref <150)
VLDL: 20 mg/dL (ref 0–40)

## 2023-01-15 LAB — COMPREHENSIVE METABOLIC PANEL
ALT: 26 U/L (ref 0–44)
AST: 30 U/L (ref 15–41)
Albumin: 4.4 g/dL (ref 3.5–5.0)
Alkaline Phosphatase: 40 U/L (ref 38–126)
Anion gap: 9 (ref 5–15)
BUN: 13 mg/dL (ref 6–20)
CO2: 23 mmol/L (ref 22–32)
Calcium: 8.9 mg/dL (ref 8.9–10.3)
Chloride: 105 mmol/L (ref 98–111)
Creatinine, Ser: 0.92 mg/dL (ref 0.44–1.00)
GFR, Estimated: >60 mL/min (ref >60)
Glucose, Bld: 134 mg/dL — ABNORMAL HIGH (ref 70–99)
Potassium: 3.6 mmol/L (ref 3.5–5.1)
Sodium: 137 mmol/L (ref 135–145)
Total Bilirubin: 0.7 mg/dL (ref 0.3–1.2)
Total Protein: 7.3 g/dL (ref 6.5–8.1)

## 2023-01-15 LAB — HEMOGLOBIN A1C
Hgb A1c MFr Bld: 6.8 % — ABNORMAL HIGH (ref 4.8–5.6)
Mean Plasma Glucose: 148.46 mg/dL

## 2023-01-17 ENCOUNTER — Encounter: Payer: Self-pay | Admitting: Family Medicine

## 2023-01-20 NOTE — Progress Notes (Unsigned)
Name: Christina Bradley   MRN: [id number]    DOB: October 21, 1961   Date:01/21/2023       Progress Note  Subjective  Chief Complaint  Consult  I connected with  Christina Bradley  on 01/21/23 at 12:00 PM EST by a video enabled telemedicine application and verified that I am speaking with the correct person using two identifiers.  I discussed the limitations of evaluation and management by telemedicine and the availability of in person appointments. The patient expressed understanding and agreed to proceed with the virtual visit  Staff also discussed with the patient that there may be a patient responsible charge related to this service. Patient Location: at home  Provider Location: [facility] Additional Individuals present: grand baby   HPI  DM II with dyslipidemia: she had a history of pre diabetes, A1C went from pre diabetes range at 6.3 % to 7.4 % in September of 2023. Lipid panel showed dyslipidemia and patient agreed on continueing life style modification and adding Rybelsus since she would like to avoid Metformin due to personal history of IBS and ulcerative colitis. She tolerated Rybelsus 3 mg , however when she took the 7 mg she noticed nausea and indigestion , she finally stopped taking medication a few weeks ago. A1C is down to 6.8 %. We discussed other therapeutic options and we will try jardiance. Discussed potential side effects such as UTI and yeast vaginitis.  She has been taking Crestor and LDL has greatly improved, at 74. I reminded her to have eye exam and come in person next visit for a foot exam  GAD: she states it is under control with Duloxetine. She used to have palpations due to anxiety but doing better    IBS and ulcerative colitis: sees GI - Dr. Fuller Plan but takes medications only prn. She denies recent flares    GERD: taking Aciphex given by GI,and symptoms are controlled    Patient Active Problem List   Diagnosis Date Noted   Dyslipidemia associated with type 2 diabetes mellitus  ([place]) 09/23/2022   Palpitations 12/31/2020   White coat hypertension 11/29/2015   GAD (generalized anxiety disorder) 11/29/2015   GERD without esophagitis 11/29/2015   IBS (irritable bowel syndrome) 09/07/2008   Irritable bowel syndrome without diarrhea 09/07/2008   Ulcerative colitis ([place]) 09/06/2008    Past Surgical History:  Procedure Laterality Date   NASAL ENDOSCOPY W/ BALLON SINUPLASTY     NASAL SINUS SURGERY  2015    Family History  Problem Relation Age of Onset   Hyperlipidemia Mother    COPD Father    Allergies Son    Colon cancer Neg Hx    Stomach cancer Neg Hx    Esophageal cancer Neg Hx    Pancreatic cancer Neg Hx    Liver disease Neg Hx    Rectal cancer Neg Hx     Social History   Socioeconomic History   Marital status: Married    Spouse name: Joe   Number of children: 2   Years of education: Not on file   Highest education level: Not on file  Occupational History   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    Social Determinants of Health   Financial Resource Strain: Low Risk  (09/18/2022)   Overall Financial Resource Strain (CARDIA)    Difficulty of Paying Living Expenses: Not hard at all  Food Insecurity: No Food Insecurity (09/18/2022)   Hunger Vital Sign    Worried About Running Out of Food in the Last Year: Never true    Ran Out of Food in the Last Year: Never true  Transportation Needs: No Transportation Needs (09/18/2022)   PRAPARE - Hydrologist (Medical): No    Lack of  Transportation (Non-Medical): No  Physical Activity: Inactive (09/18/2022)   Exercise Vital Sign    Days of Exercise per Week: 0 days    Minutes of Exercise per Session: 0 min  Stress: No Stress Concern Present (09/18/2022)   [place]    Feeling of Stress : Only a little  Social Connections: Moderately Integrated (09/18/2022)   Social Connection and Isolation Panel [NHANES]    Frequency of Communication with Friends and Family: More than three times a week    Frequency of Social Gatherings with Friends and Family: More than three times a week    Attends Religious Services: More than 4 times per year    Active Member of [employer] or Organizations: No    Attends Archivist Meetings: Never    Marital Status: Married  Human resources officer Violence: Not At Risk (09/18/2022)   Humiliation, Afraid, Rape, and Kick questionnaire    Fear of Current or Ex-Partner: No    Emotionally Abused: No    Physically Abused: No    Sexually Abused: No     Current Outpatient Medications:    Ascorbic Acid (VITAMIN C PO), Take 1 tablet by mouth daily., Disp: , Rfl:    cholecalciferol (VITAMIN D3) 25 MCG (1000 UNIT) tablet, Take 1,000 Units by mouth daily., Disp: , Rfl:    DULoxetine (CYMBALTA) 60 MG capsule, Take 1 capsule (60 mg total) by mouth daily., Disp: 90 capsule, Rfl: 3   glycopyrrolate (ROBINUL) 1 MG tablet, Take 1 tablet (1 mg total) by mouth 2 (two) times daily as needed., Disp: 60 tablet, Rfl: 3   RABEprazole (ACIPHEX) 20 MG tablet, Take 1 tablet (20 mg total) by mouth daily., Disp: 30 tablet, Rfl: 11   rosuvastatin (CRESTOR) 10 MG tablet, Take 1 tablet (10 mg total) by mouth daily., Disp: 90 tablet, Rfl: 3   Multiple Vitamins-Minerals (ZINC PO), Take 1 capsule by mouth daily. (Patient not taking: Reported on 01/21/2023), Disp: , Rfl:    Semaglutide (RYBELSUS) 7 MG TABS, Take 7 mg by mouth daily. (Patient not taking: Reported on  10/26/2022), Disp: 90 tablet, Rfl: 0  No Known Allergies  I personally reviewed active problem list, medication list, allergies, family history, social history, health maintenance with the patient/caregiver today.   ROS  Ten systems reviewed and is negative except as mentioned in HPI   Objective  Virtual encounter, vitals not obtained.  There is no height or weight on file to calculate BMI.  Physical Exam  Awake, alert and oriented , no distress  PHQ2/9:    01/21/2023   11:44 AM 10/26/2022    8:55 AM 09/23/2022    2:14 PM 09/18/2022  7:40 AM 06/20/2021    2:51 PM  Depression screen PHQ 2/9  Decreased Interest 0 0 0 0 0  Down, Depressed, Hopeless 0 0 0 0 0  PHQ - 2 Score 0 0 0 0 0  Altered sleeping 1  0 0   Tired, decreased energy 0  0 0   Change in appetite 0  0 0   Feeling bad or failure about yourself  0  0 0   Trouble concentrating 0  0 0   Moving slowly or fidgety/restless 0  0 0   Suicidal thoughts 0  0 0   PHQ-9 Score 1  0 0    PHQ-2/9 Result is negative.    Fall Risk:    01/21/2023   11:44 AM 10/26/2022    8:55 AM 09/23/2022    2:14 PM 09/18/2022    7:37 AM 06/20/2021    2:51 PM  Fall Risk   Falls in the past year? 0 0 0 0 0  Number falls in past yr: 0  0 0 0  Injury with Fall? 0  0 0 0  Risk for fall due to : No Fall Risks  No Fall Risks No Fall Risks   Follow up Falls prevention discussed  Falls prevention discussed Falls prevention discussed      Assessment & Plan  1. Dyslipidemia associated with type 2 diabetes mellitus (HCC)  - empagliflozin (JARDIANCE) 25 MG TABS tablet; Take 1 tablet (25 mg total) by mouth daily before breakfast.  Dispense: 30 tablet; Refill: 0  2. GAD (generalized anxiety disorder)   3. Dyslipidemia  Doing well on statin therapy, labs reviewed with patient   4. Irritable bowel syndrome, unspecified type  stable  5. Other ulcerative colitis without complication (HCC)  Under the care of GI, no recent flares    I discussed the assessment and treatment plan with the patient. The patient was provided an opportunity to ask questions and all were answered. The patient agreed with the plan and demonstrated an understanding of the instructions.  The patient was advised to call back or seek an in-person evaluation if the symptoms worsen or if the condition fails to improve as anticipated.  I provided 25 minutes of non-face-to-face time during this encounter.

## 2023-01-21 ENCOUNTER — Telehealth: Payer: Managed Care, Other (non HMO) | Admitting: Family Medicine

## 2023-01-21 ENCOUNTER — Encounter: Payer: Self-pay | Admitting: Family Medicine

## 2023-01-21 DIAGNOSIS — K5180 Other ulcerative colitis without complications: Secondary | ICD-10-CM | POA: Diagnosis not present

## 2023-01-21 DIAGNOSIS — F411 Generalized anxiety disorder: Secondary | ICD-10-CM | POA: Diagnosis not present

## 2023-01-21 DIAGNOSIS — K589 Irritable bowel syndrome without diarrhea: Secondary | ICD-10-CM

## 2023-01-21 DIAGNOSIS — E785 Hyperlipidemia, unspecified: Secondary | ICD-10-CM | POA: Diagnosis not present

## 2023-01-21 DIAGNOSIS — E1169 Type 2 diabetes mellitus with other specified complication: Secondary | ICD-10-CM | POA: Diagnosis not present

## 2023-01-21 MED ORDER — EMPAGLIFLOZIN 25 MG PO TABS: 25.0000 mg | ORAL_TABLET | Freq: Every day | ORAL | 0 refills | Status: DC

## 2023-02-23 ENCOUNTER — Other Ambulatory Visit: Payer: Self-pay | Admitting: Family Medicine

## 2023-02-23 DIAGNOSIS — E785 Hyperlipidemia, unspecified: Secondary | ICD-10-CM

## 2023-02-24 ENCOUNTER — Other Ambulatory Visit: Payer: Self-pay

## 2023-02-25 NOTE — Progress Notes (Signed)
Name: Christina Bradley   MRN: [id number]    DOB: 08/29/1961   Date:02/26/2023       Progress Note  Subjective  Chief Complaint  Follow Up  HPI  DM II with dyslipidemia: she had a history of pre diabetes, A1C went from pre diabetes range at 6.3 % to 7.4 % in September of 2023 , Feb 2024 value went down to 6.8 % . She took Rybelsus for a period of time, however unable to tolerate 7 mg dose and stopped on her own, she is now taking Jardiance and denies side effects of medication  She has been taking Crestor and LDL has greatly improved, at 74. Normal foot exam today.   GAD: she states it is under control with Duloxetine. She used to have palpations due to anxiety but doing better , compliant with medication    IBS and ulcerative colitis: sees GI - Dr. Fuller Plan but takes medications only prn. She denies recent flares , no abdominal pain or blood in stools.    GERD: taking Aciphex given by GI,and symptoms are controlled   Patient Active Problem List   Diagnosis Date Noted   Dyslipidemia associated with type 2 diabetes mellitus ([place]) 09/23/2022   Palpitations 12/31/2020   White coat hypertension 11/29/2015   GAD (generalized anxiety disorder) 11/29/2015   GERD without esophagitis 11/29/2015   IBS (irritable bowel syndrome) 09/07/2008   Irritable bowel syndrome without diarrhea 09/07/2008   Ulcerative colitis ([place]) 09/06/2008    Past Surgical History:  Procedure Laterality Date   NASAL ENDOSCOPY W/ BALLON SINUPLASTY     NASAL SINUS SURGERY  2015    Family History  Problem Relation Age of Onset   Hyperlipidemia Mother    COPD Father    Allergies Son    Colon cancer Neg Hx    Stomach cancer Neg Hx    Esophageal cancer Neg Hx    Pancreatic cancer Neg Hx    Liver disease Neg Hx    Rectal cancer Neg Hx     Social History   Tobacco Use   Smoking status: Never   Smokeless tobacco: Never  Substance Use Topics   Alcohol use: Not Currently     Current Outpatient Medications:     cholecalciferol (VITAMIN D3) 25 MCG (1000 UNIT) tablet, Take 1,000 Units by mouth daily., Disp: , Rfl:    DULoxetine (CYMBALTA) 60 MG capsule, Take 1 capsule (60 mg total) by mouth daily., Disp: 90 capsule, Rfl: 3   glycopyrrolate (ROBINUL) 1 MG tablet, Take 1 tablet (1 mg total) by mouth 2 (two) times daily as needed., Disp: 60 tablet, Rfl: 3   RABEprazole (ACIPHEX) 20 MG tablet, Take 1 tablet (20 mg total) by mouth daily., Disp: 30 tablet, Rfl: 11   rosuvastatin (CRESTOR) 10 MG tablet, Take 1 tablet (10 mg total) by mouth daily., Disp: 90 tablet, Rfl: 3   empagliflozin (JARDIANCE) 25 MG TABS tablet, Take 1 tablet (25 mg total) by mouth daily., Disp: 90 tablet, Rfl: 1  No Known Allergies  I personally reviewed active problem list, medication list, allergies, family history, social history, health maintenance with the patient/caregiver today.   ROS  Constitutional: Negative for fever, positive for  weight change.  Respiratory: Negative for cough and shortness of breath.   Cardiovascular: Negative for chest pain or palpitations.  Gastrointestinal: Negative for abdominal pain, no bowel changes.  Musculoskeletal: Negative for gait problem or joint swelling.  Skin: Negative for rash.  Neurological: Negative for dizziness or  headache.  No other specific complaints in a complete review of systems (except as listed in HPI above).   Objective  Vitals:   02/26/23 1045 02/26/23 1108  BP: (!) 180/82 (!) 164/88  Pulse: 90   Resp: 14   Temp: 97.7 F (36.5 C)   TempSrc: Oral   SpO2: 98%   Weight: 156 lb 4.8 oz (70.9 kg)   Height: 5\' 6"  (1.676 m)     Body mass index is 25.23 kg/m.  Physical Exam  Constitutional: Patient appears well-developed and well-nourished No distress.  HEENT: head atraumatic, normocephalic, pupils equal and reactive to light, neck supple Cardiovascular: Normal rate, regular rhythm and normal heart sounds.  No murmur heard. No BLE edema. Pulmonary/Chest: Effort  normal and breath sounds normal. No respiratory distress. Abdominal: Soft.  There is no tenderness. Psychiatric: Patient has a normal mood and affect. behavior is normal. Judgment and thought content normal.   Recent Results (from the past 2160 hour(s))  Lipid panel     Status: None   Collection Time: 01/15/23  7:46 AM  Result Value Ref Range   Cholesterol 141 0 - 200 mg/dL   Triglycerides 99 <150 mg/dL   HDL 47 >40 mg/dL   Total CHOL/HDL Ratio 3.0 RATIO   VLDL 20 0 - 40 mg/dL   LDL Cholesterol 74 0 - 99 mg/dL    Comment:        Total Cholesterol/HDL:CHD Risk Coronary Heart Disease Risk Table                     Men   Women  1/2 Average Risk   3.4   3.3  Average Risk       5.0   4.4  2 X Average Risk   9.6   7.1  3 X Average Risk  23.4   11.0        Use the calculated Patient Ratio above and the CHD Risk Table to determine the patient's CHD Risk.        ATP III CLASSIFICATION (LDL):  <100     mg/dL   Optimal  100-129  mg/dL   Near or Above                    Optimal  130-159  mg/dL   Borderline  160-189  mg/dL   High  >190     mg/dL   Very High Performed at [facility], [address] [place], [place] [address]   Hemoglobin A1c     Status: Abnormal   Collection Time: 01/15/23  7:46 AM  Result Value Ref Range   Hgb A1c MFr Bld 6.8 (H) 4.8 - 5.6 %    Comment: (NOTE) Pre diabetes:          5.7%-6.4%  Diabetes:              >6.4%  Glycemic control for   <7.0% adults with diabetes    Mean Plasma Glucose 148.46 mg/dL    Comment: Performed at [facility] [address] [place],  [address]  Comprehensive metabolic panel     Status: Abnormal   Collection Time: 01/15/23  7:46 AM  Result Value Ref Range   Sodium 137 135 - 145 mmol/L   Potassium 3.6 3.5 - 5.1 mmol/L   Chloride 105 98 - 111 mmol/L   CO2 23 22 - 32 mmol/L   Glucose, Bld 134 (H) 70 - 99 mg/dL  Comment: Glucose reference range applies only to samples taken after fasting for at  least 8 hours.   BUN 13 6 - 20 mg/dL   Creatinine, Ser 0.92 0.44 - 1.00 mg/dL   Calcium 8.9 8.9 - 10.3 mg/dL   Total Protein 7.3 6.5 - 8.1 g/dL   Albumin 4.4 3.5 - 5.0 g/dL   AST 30 15 - 41 U/L   ALT 26 0 - 44 U/L   Alkaline Phosphatase 40 38 - 126 U/L   Total Bilirubin 0.7 0.3 - 1.2 mg/dL   GFR, Estimated >60 >60 mL/min    Comment: (NOTE) Calculated using the CKD-EPI Creatinine Equation (2021)    Anion gap 9 5 - 15    Comment: Performed at [facility], [address] [place], [place] [address]    Diabetic Foot Exam: Diabetic Foot Exam - Simple   Simple Foot Form Visual Inspection No deformities, no ulcerations, no other skin breakdown bilaterally: Yes Sensation Testing Intact to touch and monofilament testing bilaterally: Yes Pulse Check Posterior Tibialis and Dorsalis pulse intact bilaterally: Yes Comments      PHQ2/9:    02/26/2023   10:47 AM 01/21/2023   11:44 AM 10/26/2022    8:55 AM 09/23/2022    2:14 PM 09/18/2022    7:37 AM  Depression screen PHQ 2/9  Decreased Interest 0 0 0 0 0  Down, Depressed, Hopeless 0 0 0 0 0  PHQ - 2 Score 0 0 0 0 0  Altered sleeping 0 1  0 0  Tired, decreased energy 0 0  0 0  Change in appetite 0 0  0 0  Feeling bad or failure about yourself  0 0  0 0  Trouble concentrating 0 0  0 0  Moving slowly or fidgety/restless 0 0  0 0  Suicidal thoughts 0 0  0 0  PHQ-9 Score 0 1  0 0    phq 9 is negative   Fall Risk:    02/26/2023   10:39 AM 01/21/2023   11:44 AM 10/26/2022    8:55 AM 09/23/2022    2:14 PM 09/18/2022    7:37 AM  Fall Risk   Falls in the past year? 0 0 0 0 0  Number falls in past yr:  0  0 0  Injury with Fall?  0  0 0  Risk for fall due to : No Fall Risks No Fall Risks  No Fall Risks No Fall Risks  Follow up Falls prevention discussed Falls prevention discussed  Falls prevention discussed Falls prevention discussed      Functional Status Survey: Is the patient deaf or have difficulty hearing?:  No Does the patient have difficulty seeing, even when wearing glasses/contacts?: No Does the patient have difficulty concentrating, remembering, or making decisions?: No Does the patient have difficulty walking or climbing stairs?: No Does the patient have difficulty dressing or bathing?: No Does the patient have difficulty doing errands alone such as visiting a doctor's office or shopping?: No    Assessment & Plan  1. Dyslipidemia associated with type 2 diabetes mellitus (HCC)  - HM Diabetes Foot Exam - empagliflozin (JARDIANCE) 25 MG TABS tablet; Take 1 tablet (25 mg total) by mouth daily.  Dispense: 90 tablet; Refill: 1  2. GAD (generalized anxiety disorder)   3. Other ulcerative colitis without complication (HCC)   4. Elevated blood pressure reading

## 2023-02-26 ENCOUNTER — Ambulatory Visit (INDEPENDENT_AMBULATORY_CARE_PROVIDER_SITE_OTHER): Payer: Managed Care, Other (non HMO) | Admitting: Family Medicine

## 2023-02-26 ENCOUNTER — Encounter: Payer: Self-pay | Admitting: Family Medicine

## 2023-02-26 ENCOUNTER — Other Ambulatory Visit: Payer: Self-pay

## 2023-02-26 VITALS — BP 164/88 | HR 90 | Temp 97.7°F | Resp 14 | Ht 66.0 in | Wt 156.3 lb

## 2023-02-26 DIAGNOSIS — K5180 Other ulcerative colitis without complications: Secondary | ICD-10-CM | POA: Diagnosis not present

## 2023-02-26 DIAGNOSIS — E1169 Type 2 diabetes mellitus with other specified complication: Secondary | ICD-10-CM

## 2023-02-26 DIAGNOSIS — F411 Generalized anxiety disorder: Secondary | ICD-10-CM

## 2023-02-26 DIAGNOSIS — Z1231 Encounter for screening mammogram for malignant neoplasm of breast: Secondary | ICD-10-CM

## 2023-02-26 DIAGNOSIS — R030 Elevated blood-pressure reading, without diagnosis of hypertension: Secondary | ICD-10-CM | POA: Diagnosis not present

## 2023-02-26 DIAGNOSIS — E785 Hyperlipidemia, unspecified: Secondary | ICD-10-CM

## 2023-02-26 MED ORDER — EMPAGLIFLOZIN 25 MG PO TABS
25.0000 mg | ORAL_TABLET | Freq: Every day | ORAL | 1 refills | Status: DC
  Filled 2023-09-01: qty 30, 30d supply, fill #0

## 2023-03-05 ENCOUNTER — Other Ambulatory Visit: Payer: Self-pay | Admitting: Family Medicine

## 2023-03-05 ENCOUNTER — Ambulatory Visit: Payer: Managed Care, Other (non HMO)

## 2023-03-05 VITALS — BP 152/80

## 2023-03-05 DIAGNOSIS — R030 Elevated blood-pressure reading, without diagnosis of hypertension: Secondary | ICD-10-CM

## 2023-03-05 DIAGNOSIS — I10 Essential (primary) hypertension: Secondary | ICD-10-CM

## 2023-03-05 MED ORDER — VALSARTAN 160 MG PO TABS: 160.0000 mg | ORAL_TABLET | Freq: Every day | ORAL | 0 refills | Status: DC

## 2023-04-23 ENCOUNTER — Ambulatory Visit: Payer: Managed Care, Other (non HMO) | Admitting: Family Medicine

## 2023-04-30 ENCOUNTER — Other Ambulatory Visit: Payer: Self-pay | Admitting: Family Medicine

## 2023-04-30 DIAGNOSIS — I10 Essential (primary) hypertension: Secondary | ICD-10-CM

## 2023-05-20 LAB — HM MAMMOGRAPHY

## 2023-06-24 NOTE — Progress Notes (Signed)
Name: Christina Bradley   MRN: [id number]    DOB: [birth year]   Date:06/25/2023       Progress Note  Subjective  Chief Complaint  Follow up   HPI  DM II with dyslipidemia: she had a history of pre diabetes, A1C went from pre diabetes range at 6.3 % to 7.4 % in September of 2023 , Feb 2024 value went down to 6.8 % but today is up to 7.56 %  . She took Rybelsus for a period of time, however unable to tolerate 7 mg dose and stopped on her own, she is now taking Jardiance and denies side effects of medication  She has been taking Crestor and LDL has greatly improved, at 74. A1C is likely up due to stress, she does not want to add or change medication at this time. She will be more mindful of her diet. She denies polyphagia, polyuria or polydipsia   HTN: she takes valsartan 160 mg , denies chest pain or palpitation, BP was high when she arrived but she has been under more stress, improved with rest. Discussed adding Atenolol since she is under more stress, she can take half pill at night   GAD: she states it is under control with Duloxetine. She used to have palpations due to anxiety but doing better , compliant with medication . Heart rate is up today and also her bp, she has been under more stress since husband has been hospitalized, admission date was July 3rd and he is still in the ICU   IBS and ulcerative colitis: sees GI - Dr. Russella Dar but takes medications , glycopyrrolate prn only . She denies recent flares , no abdominal pain or blood in stools.    GERD: taking Aciphex given by GI, symptoms are controlled at this time   Patient Active Problem List   Diagnosis Date Noted   Dyslipidemia associated with type 2 diabetes mellitus (HCC) 09/23/2022   Palpitations 12/31/2020   White coat hypertension 11/29/2015   GAD (generalized anxiety disorder) 11/29/2015   GERD without esophagitis 11/29/2015   IBS (irritable bowel syndrome) 09/07/2008   Irritable bowel syndrome without diarrhea 09/07/2008    Ulcerative colitis (HCC) 09/06/2008    Past Surgical History:  Procedure Laterality Date   NASAL ENDOSCOPY W/ BALLON SINUPLASTY     NASAL SINUS SURGERY  2015    Family History  Problem Relation Age of Onset   Hyperlipidemia Mother    COPD Father    Allergies Son    Colon cancer Neg Hx    Stomach cancer Neg Hx    Esophageal cancer Neg Hx    Pancreatic cancer Neg Hx    Liver disease Neg Hx    Rectal cancer Neg Hx     Social History   Tobacco Use   Smoking status: Never   Smokeless tobacco: Never  Substance Use Topics   Alcohol use: Not Currently     Current Outpatient Medications:    cholecalciferol (VITAMIN D3) 25 MCG (1000 UNIT) tablet, Take 1,000 Units by mouth daily., Disp: , Rfl:    DULoxetine (CYMBALTA) 60 MG capsule, Take 1 capsule (60 mg total) by mouth daily., Disp: 90 capsule, Rfl: 3   empagliflozin (JARDIANCE) 25 MG TABS tablet, Take 1 tablet (25 mg total) by mouth daily., Disp: 90 tablet, Rfl: 1   glycopyrrolate (ROBINUL) 1 MG tablet, Take 1 tablet (1 mg total) by mouth 2 (two) times daily as needed., Disp: 60 tablet, Rfl: 3   RABEprazole (ACIPHEX)  20 MG tablet, Take 1 tablet (20 mg total) by mouth daily., Disp: 30 tablet, Rfl: 11   rosuvastatin (CRESTOR) 10 MG tablet, Take 1 tablet (10 mg total) by mouth daily., Disp: 90 tablet, Rfl: 3   valsartan (DIOVAN) 160 MG tablet, TAKE 1 TABLET BY MOUTH DAILY., Disp: 90 tablet, Rfl: 0  No Known Allergies  I personally reviewed active problem list, medication list, allergies, family history, social history, health maintenance with the patient/caregiver today.   ROS  Constitutional: Negative for fever or weight change.  Respiratory: Negative for cough and shortness of breath.   Cardiovascular: Negative for chest pain or palpitations.  Gastrointestinal: Negative for abdominal pain, no bowel changes.  Musculoskeletal: Negative for gait problem or joint swelling.  Skin: Negative for rash.  Neurological: Negative for  dizziness or headache.  No other specific complaints in a complete review of systems (except as listed in HPI above).   Objective  Vitals:   06/25/23 1458  BP: (!) 154/86  Pulse: 94  Resp: 16  Temp: 97.8 F (36.6 C)  TempSrc: Oral  SpO2: 98%  Weight: 154 lb 1.6 oz (69.9 kg)  Height: 5\' 6"  (1.676 m)    Body mass index is 24.87 kg/m.  Physical Exam  Constitutional: Patient appears well-developed and well-nourished. No distress.  HEENT: head atraumatic, normocephalic, pupils equal and reactive to light, neck supple, throat within normal limits Cardiovascular: Normal rate, regular rhythm and normal heart sounds.  No murmur heard. No BLE edema. Pulmonary/Chest: Effort normal and breath sounds normal. No respiratory distress. Abdominal: Soft.  There is no tenderness. Psychiatric: Patient has a normal mood and affect. behavior is normal. Judgment and thought content normal.   Recent Results (from the past 2160 hour(s))  HM MAMMOGRAPHY     Status: None   Collection Time: 05/20/23 12:00 AM  Result Value Ref Range   HM Mammogram 0-4 Bi-Rad 0-4 Bi-Rad, Self Reported Normal    Comment: [facility]  POCT HgB A1C     Status: Abnormal   Collection Time: 06/25/23  3:08 PM  Result Value Ref Range   Hemoglobin A1C 7.5 (A) 4.0 - 5.6 %   HbA1c POC (<> result, manual entry)     HbA1c, POC (prediabetic range)     HbA1c, POC (controlled diabetic range)       PHQ2/9:    06/25/2023    2:56 PM 02/26/2023   10:47 AM 01/21/2023   11:44 AM 10/26/2022    8:55 AM 09/23/2022    2:14 PM  Depression screen PHQ 2/9  Decreased Interest 0 0 0 0 0  Down, Depressed, Hopeless 0 0 0 0 0  PHQ - 2 Score 0 0 0 0 0  Altered sleeping 0 0 1  0  Tired, decreased energy 0 0 0  0  Change in appetite 0 0 0  0  Feeling bad or failure about yourself  0 0 0  0  Trouble concentrating 0 0 0  0  Moving slowly or fidgety/restless 0 0 0  0  Suicidal thoughts 0 0 0  0  PHQ-9 Score 0 0 1  0  Difficult doing work/chores  Not difficult at all        phq 9 is negative   Fall Risk:    06/25/2023    2:56 PM 02/26/2023   10:39 AM 01/21/2023   11:44 AM 10/26/2022    8:55 AM 09/23/2022    2:14 PM  Fall Risk   Falls in the past  year? 0 0 0 0 0  Number falls in past yr: 0  0  0  Injury with Fall? 0  0  0  Risk for fall due to : No Fall Risks No Fall Risks No Fall Risks  No Fall Risks  Follow up Falls prevention discussed;Education provided;Falls evaluation completed Falls prevention discussed Falls prevention discussed  Falls prevention discussed      Functional Status Survey: Is the patient deaf or have difficulty hearing?: No Does the patient have difficulty seeing, even when wearing glasses/contacts?: No Does the patient have difficulty concentrating, remembering, or making decisions?: No Does the patient have difficulty walking or climbing stairs?: No Does the patient have difficulty dressing or bathing?: No Does the patient have difficulty doing errands alone such as visiting a doctor's office or shopping?: No    Assessment & Plan  1. Dyslipidemia associated with type 2 diabetes mellitus (HCC)  - POCT HgB A1C - Ambulatory referral to Ophthalmology  2. GAD (generalized anxiety disorder)  Under more stress, continue medication  3. Other ulcerative colitis without complication (HCC)  Doing well   4. Hypertension, benign  - valsartan (DIOVAN) 160 MG tablet; Take 1 tablet (160 mg total) by mouth daily.  Dispense: 90 tablet; Refill: 1

## 2023-06-25 ENCOUNTER — Encounter: Payer: Self-pay | Admitting: Family Medicine

## 2023-06-25 ENCOUNTER — Ambulatory Visit (INDEPENDENT_AMBULATORY_CARE_PROVIDER_SITE_OTHER): Payer: Managed Care, Other (non HMO) | Admitting: Family Medicine

## 2023-06-25 VITALS — BP 136/86 | HR 94 | Temp 97.8°F | Resp 16 | Ht 66.0 in | Wt 154.1 lb

## 2023-06-25 DIAGNOSIS — E785 Hyperlipidemia, unspecified: Secondary | ICD-10-CM | POA: Diagnosis not present

## 2023-06-25 DIAGNOSIS — K5180 Other ulcerative colitis without complications: Secondary | ICD-10-CM

## 2023-06-25 DIAGNOSIS — I10 Essential (primary) hypertension: Secondary | ICD-10-CM

## 2023-06-25 DIAGNOSIS — E1169 Type 2 diabetes mellitus with other specified complication: Secondary | ICD-10-CM | POA: Diagnosis not present

## 2023-06-25 DIAGNOSIS — F411 Generalized anxiety disorder: Secondary | ICD-10-CM

## 2023-06-25 LAB — POCT GLYCOSYLATED HEMOGLOBIN (HGB A1C): Hemoglobin A1C: 7.5 % — AB (ref 4.0–5.6)

## 2023-06-25 MED ORDER — VALSARTAN 160 MG PO TABS
160.0000 mg | ORAL_TABLET | Freq: Every day | ORAL | 1 refills | Status: DC
  Filled 2023-09-14: qty 90, 90d supply, fill #0

## 2023-06-25 MED ORDER — ATENOLOL 25 MG PO TABS: 25.0000 mg | ORAL_TABLET | Freq: Every day | ORAL | 0 refills | Status: DC

## 2023-07-02 ENCOUNTER — Ambulatory Visit: Payer: Managed Care, Other (non HMO) | Admitting: Family Medicine

## 2023-07-05 ENCOUNTER — Other Ambulatory Visit: Payer: Self-pay | Admitting: Gastroenterology

## 2023-08-11 ENCOUNTER — Other Ambulatory Visit: Payer: Self-pay | Admitting: Gastroenterology

## 2023-08-16 ENCOUNTER — Other Ambulatory Visit: Payer: Self-pay

## 2023-08-17 ENCOUNTER — Other Ambulatory Visit: Payer: Self-pay

## 2023-08-17 MED ORDER — RYBELSUS 7 MG PO TABS: 7.0000 mg | ORAL_TABLET | Freq: Every day | ORAL | 0 refills | Status: DC

## 2023-08-17 MED ORDER — ATENOLOL 25 MG PO TABS: 12.5000 mg | ORAL_TABLET | Freq: Every evening | ORAL | 0 refills | Status: DC

## 2023-08-17 MED ORDER — VALSARTAN 160 MG PO TABS: 160.0000 mg | ORAL_TABLET | Freq: Every day | ORAL | 0 refills | Status: DC

## 2023-09-01 ENCOUNTER — Other Ambulatory Visit: Payer: Self-pay

## 2023-09-02 ENCOUNTER — Other Ambulatory Visit: Payer: Self-pay

## 2023-09-06 ENCOUNTER — Other Ambulatory Visit: Payer: Self-pay | Admitting: Gastroenterology

## 2023-09-14 ENCOUNTER — Other Ambulatory Visit: Payer: Self-pay

## 2023-09-23 NOTE — Progress Notes (Addendum)
Name: Christina Bradley   MRN: [id number]    DOB: Dec 15, 1960   Date:09/24/2023       Progress Note  Subjective  Chief Complaint  Annual Exam  HPI  Patient presents for annual CPE and follow up ( she is aware possible extra charge)   DM II with dyslipidemia: she had a history of pre diabetes, A1C went from pre diabetes range at 6.3 % to 7.4 % in September of 2023 , Feb 2024 value went down to 6.8 %  last visit was  7.5 %  we will recheck it today  . She try Rybelsus but had indigestion but is doing well on Jardiance.   She has been taking Crestor and LDL has greatly improved, at 74. She denies polyphagia, polyuria or polydipsia    HTN: she takes valsartan 160 mg , denies chest pain or palpitation, BP is high again and we will adjust the dose, we tried Atenolol at night but she could not get up in am so she stopped medication. BP goal is 120/80, still elevated. Advised her to check bp at home and if around 120/70-80 we can change dose back to 160 mg daily    GAD: she states it is under control with Duloxetine. She used to have palpations due to anxiety but doing better , compliant with medication . Continue medication    IBS and ulcerative colitis: sees GI - Dr. Russella Dar but takes medications , glycopyrrolate prn only . She denies recent flares , no abdominal pain or blood in stools. Unchanged    GERD: taking Aciphex given by GI, symptoms are controlled at this time . GI physician is retiring , advised her to schedule a visit with a new provider      Diet: balanced diet  Exercise: discussed regular physical activity   Last Eye Exam: up to date  Last Dental Exam: up to date  Flowsheet Row Office Visit from 09/24/2023 in [facility]  AUDIT-C Score 1      Depression: Phq 9 is  negative    09/24/2023    8:40 AM 06/25/2023    2:56 PM 02/26/2023   10:47 AM 01/21/2023   11:44 AM 10/26/2022    8:55 AM  Depression screen PHQ 2/9  Decreased Interest 0 0 0 0 0  Down,  Depressed, Hopeless 0 0 0 0 0  PHQ - 2 Score 0 0 0 0 0  Altered sleeping 0 0 0 1   Tired, decreased energy 0 0 0 0   Change in appetite 0 0 0 0   Feeling bad or failure about yourself  0 0 0 0   Trouble concentrating 0 0 0 0   Moving slowly or fidgety/restless 0 0 0 0   Suicidal thoughts 0 0 0 0   PHQ-9 Score 0 0 0 1   Difficult doing work/chores  Not difficult at all      Hypertension: BP Readings from Last 3 Encounters:  09/24/23 132/82  06/25/23 136/86  03/05/23 (!) 152/80   Obesity: Wt Readings from Last 3 Encounters:  09/24/23 156 lb 8 oz (71 kg)  06/25/23 154 lb 1.6 oz (69.9 kg)  02/26/23 156 lb 4.8 oz (70.9 kg)   BMI Readings from Last 3 Encounters:  09/24/23 25.26 kg/m  06/25/23 24.87 kg/m  02/26/23 25.23 kg/m     Vaccines:    RSV: discussed with patient  Tdap: up to date Shingrix: N/A Pneumonia: N/A Flu: she will get  it at work  COVID-19: refused    Hep C Screening: up to date STD testing and prevention (HIV/chl/gon/syphilis): N/A Intimate partner violence: negative screen  Sexual History : not currently  Menstrual History/LMP/Abnormal Bleeding: post-menopausal  Discussed importance of follow up if any post-menopausal bleeding: yes  Incontinence Symptoms: negative for symptoms   Breast cancer:  - Last Mammogram: 05/20/23 - BRCA gene screening: N/A  Osteoporosis Prevention : Discussed high calcium and vitamin D supplementation, weight bearing exercises Bone density: N/A  Cervical cancer screening: 06/20/21  Skin cancer: Discussed monitoring for atypical lesions  Colorectal cancer: 09/02/22   Lung cancer:  Low Dose CT Chest recommended if Age 108-80 years, 20 pack-year currently smoking OR have quit w/in 15years. Patient does not qualify for screen   ECG: next visit   Advanced Care Planning: A voluntary discussion about advance care planning including the explanation and discussion of advance directives.  Discussed health care proxy and Living  will, and the patient was able to identify a health care proxy as children .  Patient has a  living will and power of attorney of health care   Lipids: Lab Results  Component Value Date   CHOL 141 01/15/2023   CHOL 299 (H) 09/18/2022   CHOL 246 (H) 02/02/2019   Lab Results  Component Value Date   HDL 47 01/15/2023   HDL 47 (L) 09/18/2022   HDL 43 (L) 02/02/2019   Lab Results  Component Value Date   LDLCALC 74 01/15/2023   LDLCALC 208 (H) 09/18/2022   LDLCALC 166 (H) 02/02/2019   Lab Results  Component Value Date   TRIG 99 01/15/2023   TRIG 236 (H) 09/18/2022   TRIG 209 (H) 02/02/2019   Lab Results  Component Value Date   CHOLHDL 3.0 01/15/2023   CHOLHDL 6.4 (H) 09/18/2022   CHOLHDL 5.7 (H) 02/02/2019   No results found for: "LDLDIRECT"  Glucose: Glucose, Bld  Date Value Ref Range Status  01/15/2023 134 (H) 70 - 99 mg/dL Final    Comment:    Glucose reference range applies only to samples taken after fasting for at least 8 hours.  09/18/2022 151 (H) 65 - 99 mg/dL Final    Comment:    .            Fasting reference interval . For someone without known diabetes, a glucose value >125 mg/dL indicates that they may have diabetes and this should be confirmed with a follow-up test. .   02/02/2019 82 65 - 99 mg/dL Final    Comment:    .            Fasting reference interval .     Patient Active Problem List   Diagnosis Date Noted   Dyslipidemia associated with type 2 diabetes mellitus (HCC) 09/23/2022   Palpitations 12/31/2020   White coat hypertension 11/29/2015   GAD (generalized anxiety disorder) 11/29/2015   GERD without esophagitis 11/29/2015   IBS (irritable bowel syndrome) 09/07/2008   Irritable bowel syndrome without diarrhea 09/07/2008   Ulcerative colitis (HCC) 09/06/2008    Past Surgical History:  Procedure Laterality Date   NASAL ENDOSCOPY W/ BALLON SINUPLASTY     NASAL SINUS SURGERY  2015    Family History  Problem Relation Age of  Onset   Hyperlipidemia Mother    COPD Father    Allergies Son    Colon cancer Neg Hx    Stomach cancer Neg Hx    Esophageal cancer Neg Hx  Pancreatic cancer Neg Hx    Liver disease Neg Hx    Rectal cancer Neg Hx     Social History   Socioeconomic History   Marital status: Widowed    Spouse name: Joe   Number of children: 2   Years of education: Not on file   Highest education level: Not on file  Occupational History   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    Social Determinants of Health   Financial Resource Strain: Low Risk  (09/24/2023)   Overall Financial Resource Strain (CARDIA)    Difficulty of Paying Living Expenses: Not hard at all  Food Insecurity: No Food Insecurity (09/24/2023)   Hunger Vital Sign    Worried About Running Out of Food in the Last Year: Never true    Ran Out of Food in the Last Year: Never true  Transportation Needs: No Transportation Needs (09/24/2023)   PRAPARE - Administrator, Civil Service (Medical): No    Lack of Transportation (Non-Medical): No  Physical Activity: Insufficiently Active (09/24/2023)   Exercise Vital Sign    Days of Exercise per Week: 3 days    Minutes of Exercise per Session: 30 min  Stress: No Stress Concern Present (09/24/2023)   [employer] of Occupational Health - Occupational Stress Questionnaire    Feeling of Stress : Only a little  Social Connections: Unknown (09/24/2023)   Social Connection and Isolation Panel [NHANES]    Frequency of Communication  with Friends and Family: More than three times a week    Frequency of Social Gatherings with Friends and Family: More than three times a week    Attends Religious Services: Not on file    Active Member of Clubs or Organizations: Yes    Attends Banker Meetings: More than 4 times per year    Marital Status: Married  Catering manager Violence: Not At Risk (09/24/2023)   Humiliation, Afraid, Rape, and Kick questionnaire    Fear of Current or Ex-Partner: No    Emotionally Abused: No    Physically Abused: No    Sexually Abused: No     Current Outpatient Medications:    cholecalciferol (VITAMIN D3) 25 MCG (1000 UNIT) tablet, Take 1,000 Units by mouth daily., Disp: , Rfl:    glycopyrrolate (ROBINUL) 1 MG tablet, Take 1 tablet (1 mg total) by mouth 2 (two) times daily as needed., Disp: 60 tablet, Rfl: 3   RABEprazole (ACIPHEX) 20 MG tablet, TAKE 1 TABLET BY MOUTH DAILY, Disp: 30 tablet, Rfl: 0   DULoxetine (CYMBALTA) 60 MG capsule, Take 1 capsule (60 mg total) by mouth daily., Disp: 90 capsule, Rfl: 1   empagliflozin (JARDIANCE) 25 MG TABS tablet, Take 1 tablet (25 mg total) by mouth daily., Disp: 90 tablet, Rfl: 1   rosuvastatin (CRESTOR) 10 MG tablet, Take 1 tablet (10 mg total) by mouth daily., Disp: 90 tablet, Rfl: 1   valsartan (DIOVAN) 320 MG tablet, Take 1 tablet (320  mg total) by mouth daily., Disp: 90 tablet, Rfl: 1  No Known Allergies   ROS  Constitutional: Negative for fever or weight change.  Respiratory: Negative for cough and shortness of breath.   Cardiovascular: Negative for chest pain or palpitations.  Gastrointestinal: Negative for abdominal pain, no bowel changes.  Musculoskeletal: Negative for gait problem or joint swelling.  Skin: Negative for rash.  Neurological: Negative for dizziness or headache.  No other specific complaints in a complete review of systems (except as listed in HPI above).   Objective  Vitals:   09/24/23 0839 09/24/23 0923  BP:  (!) 146/86 132/82  Pulse: 95   Resp: 14   Temp: 98.1 F (36.7 C)   TempSrc: Oral   SpO2: 96%   Weight: 156 lb 8 oz (71 kg)   Height: 5\' 6"  (1.676 m)     Body mass index is 25.26 kg/m.  Physical Exam  Constitutional: Patient appears well-developed and well-nourished. No distress.  HENT: Head: Normocephalic and atraumatic. Ears: B TMs ok, no erythema or effusion; Nose: Nose normal. Mouth/Throat: Oropharynx is clear and moist. No oropharyngeal exudate.  Eyes: Conjunctivae and EOM are normal. Pupils are equal, round, and reactive to light. No scleral icterus.  Neck: Normal range of motion. Neck supple. No JVD present. No thyromegaly present.  Cardiovascular: Normal rate, regular rhythm and normal heart sounds.  No murmur heard. No BLE edema. Pulmonary/Chest: Effort normal and breath sounds normal. No respiratory distress. Abdominal: Soft. Bowel sounds are normal, no distension. There is no tenderness. no masses Breast: no lumps or masses, no nipple discharge or rashes FEMALE GENITALIA:  Not done  RECTAL: not done  Musculoskeletal: Normal range of motion, no joint effusions. No gross deformities Neurological: he is alert and oriented to person, place, and time. No cranial nerve deficit. Coordination, balance, strength, speech and gait are normal.  Skin: Skin is warm and dry. No rash noted. No erythema.  Psychiatric: Patient has a normal mood and affect. behavior is normal. Judgment and thought content normal.    Fall Risk:    09/24/2023    8:42 AM 06/25/2023    2:56 PM 02/26/2023   10:39 AM 01/21/2023   11:44 AM 10/26/2022    8:55 AM  Fall Risk   Falls in the past year? 0 0 0 0 0  Number falls in past yr:  0  0   Injury with Fall?  0  0   Risk for fall due to : No Fall Risks No Fall Risks No Fall Risks No Fall Risks   Follow up Falls prevention discussed;Education provided;Falls evaluation completed Falls prevention discussed;Education provided;Falls evaluation completed Falls  prevention discussed Falls prevention discussed      Functional Status Survey: Is the patient deaf or have difficulty hearing?: No Does the patient have difficulty seeing, even when wearing glasses/contacts?: No Does the patient have difficulty concentrating, remembering, or making decisions?: No Does the patient have difficulty walking or climbing stairs?: No Does the patient have difficulty dressing or bathing?: No Does the patient have difficulty doing errands alone such as visiting a doctor's office or shopping?: No   Assessment & Plan  1. Well adult exam  - Lipid panel - Microalbumin / creatinine urine ratio - Hemoglobin A1c - COMPLETE METABOLIC PANEL WITH GFR - CBC with Differential/Platelet  2. Need for immunization against influenza  She will get it at work   3. Dyslipidemia associated with type 2 diabetes mellitus (HCC)  - empagliflozin (JARDIANCE) 25  MG TABS tablet; Take 1 tablet (25 mg total) by mouth daily.  Dispense: 90 tablet; Refill: 1 - rosuvastatin (CRESTOR) 10 MG tablet; Take 1 tablet (10 mg total) by mouth daily.  Dispense: 90 tablet; Refill: 1 - Lipid panel - Microalbumin / creatinine urine ratio - Hemoglobin A1c - EKG 12-Lead  4. GAD (generalized anxiety disorder)  - DULoxetine (CYMBALTA) 60 MG capsule; Take 1 capsule (60 mg total) by mouth daily.  Dispense: 90 capsule; Refill: 1  5. Irritable bowel syndrome, unspecified type  - DULoxetine (CYMBALTA) 60 MG capsule; Take 1 capsule (60 mg total) by mouth daily.  Dispense: 90 capsule; Refill: 1  6. Hypertension, benign  - valsartan (DIOVAN) 320 MG tablet; Take 1 tablet (320 mg total) by mouth daily.  Dispense: 90 tablet; Refill: 1 - COMPLETE METABOLIC PANEL WITH GFR - CBC with Differential/Platelet - EKG 12-Lead  7. Other ulcerative colitis without complication (HCC)     -USPSTF grade A and B recommendations reviewed with patient; age-appropriate recommendations, preventive care, screening  tests, etc discussed and encouraged; healthy living encouraged; see AVS for patient education given to patient -Discussed importance of 150 minutes of physical activity weekly, eat two servings of fish weekly, eat one serving of tree nuts ( cashews, pistachios, pecans, almonds.Marland Kitchen) every other day, eat 6 servings of fruit/vegetables daily and drink plenty of water and avoid sweet beverages.   -Reviewed Health Maintenance: Yes.

## 2023-09-24 ENCOUNTER — Ambulatory Visit (INDEPENDENT_AMBULATORY_CARE_PROVIDER_SITE_OTHER): Payer: Managed Care, Other (non HMO) | Admitting: Family Medicine

## 2023-09-24 ENCOUNTER — Encounter: Payer: Self-pay | Admitting: Family Medicine

## 2023-09-24 ENCOUNTER — Other Ambulatory Visit: Payer: Self-pay

## 2023-09-24 VITALS — BP 132/82 | HR 95 | Temp 98.1°F | Resp 14 | Ht 66.0 in | Wt 156.5 lb

## 2023-09-24 DIAGNOSIS — E785 Hyperlipidemia, unspecified: Secondary | ICD-10-CM

## 2023-09-24 DIAGNOSIS — E1169 Type 2 diabetes mellitus with other specified complication: Secondary | ICD-10-CM | POA: Diagnosis not present

## 2023-09-24 DIAGNOSIS — Z0000 Encounter for general adult medical examination without abnormal findings: Secondary | ICD-10-CM | POA: Diagnosis not present

## 2023-09-24 DIAGNOSIS — F411 Generalized anxiety disorder: Secondary | ICD-10-CM | POA: Diagnosis not present

## 2023-09-24 DIAGNOSIS — I10 Essential (primary) hypertension: Secondary | ICD-10-CM

## 2023-09-24 DIAGNOSIS — K589 Irritable bowel syndrome without diarrhea: Secondary | ICD-10-CM | POA: Diagnosis not present

## 2023-09-24 DIAGNOSIS — Z7984 Long term (current) use of oral hypoglycemic drugs: Secondary | ICD-10-CM

## 2023-09-24 DIAGNOSIS — K5180 Other ulcerative colitis without complications: Secondary | ICD-10-CM

## 2023-09-24 DIAGNOSIS — Z23 Encounter for immunization: Secondary | ICD-10-CM

## 2023-09-24 MED ORDER — VALSARTAN 320 MG PO TABS
320.0000 mg | ORAL_TABLET | Freq: Every day | ORAL | 1 refills | Status: DC
Start: 2023-09-24 — End: 2024-04-14
  Filled 2023-09-24 – 2023-12-27 (×2): qty 90, 90d supply, fill #0
  Filled 2024-01-17 – 2024-04-06 (×2): qty 90, 90d supply, fill #1

## 2023-09-24 MED ORDER — ROSUVASTATIN CALCIUM 10 MG PO TABS
10.0000 mg | ORAL_TABLET | Freq: Every day | ORAL | 1 refills | Status: DC
Start: 2023-09-24 — End: 2024-04-14
  Filled 2023-09-24: qty 90, 90d supply, fill #0
  Filled 2024-01-17: qty 90, 90d supply, fill #1

## 2023-09-24 MED ORDER — VALSARTAN 320 MG PO TABS
320.0000 mg | ORAL_TABLET | Freq: Every day | ORAL | 0 refills | Status: DC
  Filled 2023-09-24: qty 90, 90d supply, fill #0

## 2023-09-24 MED ORDER — EMPAGLIFLOZIN 25 MG PO TABS
25.0000 mg | ORAL_TABLET | Freq: Every day | ORAL | 1 refills | Status: DC
Start: 2023-09-24 — End: 2024-01-14
  Filled 2023-09-24 – 2023-10-28 (×2): qty 90, 90d supply, fill #0

## 2023-09-24 MED ORDER — DULOXETINE HCL 60 MG PO CPEP
60.0000 mg | ORAL_CAPSULE | Freq: Every day | ORAL | 1 refills | Status: DC
Start: 2023-09-24 — End: 2024-04-14
  Filled 2023-09-24: qty 90, 90d supply, fill #0
  Filled 2024-01-17: qty 90, 90d supply, fill #1

## 2023-09-25 LAB — COMPLETE METABOLIC PANEL WITH GFR
AG Ratio: 2 (calc) (ref 1.0–2.5)
ALT: 19 U/L (ref 6–29)
AST: 19 U/L (ref 10–35)
Albumin: 4.6 g/dL (ref 3.6–5.1)
Alkaline phosphatase (APISO): 52 U/L (ref 37–153)
BUN: 12 mg/dL (ref 7–25)
CO2: 32 mmol/L (ref 20–32)
Calcium: 10.3 mg/dL (ref 8.6–10.4)
Chloride: 101 mmol/L (ref 98–110)
Creat: 0.75 mg/dL (ref 0.50–1.05)
Globulin: 2.3 g/dL (ref 1.9–3.7)
Glucose, Bld: 131 mg/dL — ABNORMAL HIGH (ref 65–99)
Potassium: 4.7 mmol/L (ref 3.5–5.3)
Sodium: 140 mmol/L (ref 135–146)
Total Bilirubin: 0.5 mg/dL (ref 0.2–1.2)
Total Protein: 6.9 g/dL (ref 6.1–8.1)
eGFR: 91 mL/min/{1.73_m2} (ref >=60)

## 2023-09-25 LAB — CBC WITH DIFFERENTIAL/PLATELET
Absolute Lymphocytes: 2030 {cells}/uL (ref 850–3900)
Absolute Monocytes: 697 {cells}/uL (ref 200–950)
Basophils Absolute: 52 {cells}/uL (ref 0–200)
Basophils Relative: 0.6 %
Eosinophils Absolute: 189 {cells}/uL (ref 15–500)
Eosinophils Relative: 2.2 %
HCT: 45.8 % — ABNORMAL HIGH (ref 35.0–45.0)
Hemoglobin: 14.8 g/dL (ref 11.7–15.5)
MCH: 29.7 pg (ref 27.0–33.0)
MCHC: 32.3 g/dL (ref 32.0–36.0)
MCV: 91.8 fL (ref 80.0–100.0)
MPV: 11.3 fL (ref 7.5–12.5)
Monocytes Relative: 8.1 %
Neutro Abs: 5633 {cells}/uL (ref 1500–7800)
Neutrophils Relative %: 65.5 %
Platelets: 244 10*3/uL (ref 140–400)
RBC: 4.99 10*6/uL (ref 3.80–5.10)
RDW: 12.8 % (ref 11.0–15.0)
Total Lymphocyte: 23.6 %
WBC: 8.6 10*3/uL (ref 3.8–10.8)

## 2023-09-25 LAB — MICROALBUMIN / CREATININE URINE RATIO
Creatinine, Urine: 100 mg/dL (ref 20–275)
Microalb Creat Ratio: 9 mg/g{creat} (ref <30)
Microalb, Ur: 0.9 mg/dL

## 2023-09-25 LAB — LIPID PANEL
Cholesterol: 160 mg/dL (ref <200)
HDL: 50 mg/dL (ref >=50)
LDL Cholesterol (Calc): 84 mg/dL
Non-HDL Cholesterol (Calc): 110 mg/dL (ref <130)
Total CHOL/HDL Ratio: 3.2 (calc) (ref <5.0)
Triglycerides: 165 mg/dL — ABNORMAL HIGH (ref <150)

## 2023-09-25 LAB — HEMOGLOBIN A1C
Hgb A1c MFr Bld: 7.8 %{Hb} — ABNORMAL HIGH (ref <5.7)
Mean Plasma Glucose: 177 mg/dL
eAG (mmol/L): 9.8 mmol/L

## 2023-10-08 ENCOUNTER — Ambulatory Visit: Payer: Managed Care, Other (non HMO) | Admitting: Family Medicine

## 2023-10-28 ENCOUNTER — Other Ambulatory Visit: Payer: Self-pay

## 2023-12-21 ENCOUNTER — Ambulatory Visit: Payer: Managed Care, Other (non HMO) | Admitting: Family Medicine

## 2023-12-27 ENCOUNTER — Other Ambulatory Visit: Payer: Self-pay

## 2024-01-06 ENCOUNTER — Encounter: Payer: Self-pay | Admitting: Family Medicine

## 2024-01-14 ENCOUNTER — Encounter: Payer: Self-pay | Admitting: Family Medicine

## 2024-01-14 ENCOUNTER — Ambulatory Visit: Payer: Managed Care, Other (non HMO) | Admitting: Family Medicine

## 2024-01-14 VITALS — BP 134/76 | HR 96 | Resp 16 | Ht 66.0 in | Wt 160.5 lb

## 2024-01-14 DIAGNOSIS — F411 Generalized anxiety disorder: Secondary | ICD-10-CM

## 2024-01-14 DIAGNOSIS — I10 Essential (primary) hypertension: Secondary | ICD-10-CM

## 2024-01-14 DIAGNOSIS — E785 Hyperlipidemia, unspecified: Secondary | ICD-10-CM

## 2024-01-14 DIAGNOSIS — Z7984 Long term (current) use of oral hypoglycemic drugs: Secondary | ICD-10-CM

## 2024-01-14 DIAGNOSIS — K5180 Other ulcerative colitis without complications: Secondary | ICD-10-CM

## 2024-01-14 DIAGNOSIS — E1169 Type 2 diabetes mellitus with other specified complication: Secondary | ICD-10-CM

## 2024-01-14 LAB — POCT GLYCOSYLATED HEMOGLOBIN (HGB A1C): Hemoglobin A1C: 7.7 % — AB (ref 4.0–5.6)

## 2024-01-14 NOTE — Patient Instructions (Signed)
 Mounjaro Metformin Actos

## 2024-01-14 NOTE — Progress Notes (Signed)
 Name: Christina Bradley   MRN: [id number]    DOB: 02-03-1961   Date:01/14/2024       Progress Note  Subjective  Chief Complaint  Chief Complaint  Patient presents with   Medical Management of Chronic Issues    HTN- Req Med change, pt states about a week ago noticed systolic in the 180s but has been trying to drink more water and better diet   HPI   DM II with dyslipidemia: she had a history of pre diabetes, A1C went from pre diabetes range at 6.3 % to 7.4 % in September of 2023 . She try Rybelsus  but had indigestion but is doing well on Jardiance , last A1C was 7.8 % , she stopped taking Jardiance  due to recurrent yeast infection in [facility] November. She has been taking Crestor  for dyslipidemia. She has been avoiding carbohydrates.. She denies polyphagia, polyuria or polydipsia . Discussed Mounajaro, Metformin and Actos, she wants to try diet and exercise until her next visit in 3 months, if A1C still above 7 % she will choose one of the medications discussed today   HTN: she has been taking Valsartan  320 mg daily today bp is at goal, however two weeks ago it was stressful weekend, she noticed a headache and decided to check her bp and it had spiked to 180 SBP. She has not checked her bp at home since and did not bring her monitor. Explained bp today is at goal.    GAD: she states it is under control with Duloxetine . She used to have palpations due to anxiety but doing better  compliant with medication. Unchanged    IBS and ulcerative colitis: she used to see Dr. Aneita but he retired, she is still going to follow up with GI as recommended. ut takes medications , glycopyrrolate  prn only . She denies recent flares , no abdominal pain or blood in stools. Not on medication   GERD: taking Aciphex  given by GI, symptoms are controlled at this time . GI physician is retiring , advised her to schedule a visit with a new provider    Patient Active Problem List   Diagnosis Date Noted   Dyslipidemia associated  with type 2 diabetes mellitus (HCC) 09/23/2022   Palpitations 12/31/2020   White coat hypertension 11/29/2015   GAD (generalized anxiety disorder) 11/29/2015   GERD without esophagitis 11/29/2015   IBS (irritable bowel syndrome) 09/07/2008   Irritable bowel syndrome without diarrhea 09/07/2008   Ulcerative colitis (HCC) 09/06/2008    Past Surgical History:  Procedure Laterality Date   NASAL ENDOSCOPY W/ BALLON SINUPLASTY     NASAL SINUS SURGERY  2015    Family History  Problem Relation Age of Onset   Hyperlipidemia Mother    COPD Father    Allergies Son    Colon cancer Neg Hx    Stomach cancer Neg Hx    Esophageal cancer Neg Hx    Pancreatic cancer Neg Hx    Liver disease Neg Hx    Rectal cancer Neg Hx     Social History   Tobacco Use   Smoking status: Never   Smokeless tobacco: Never  Substance Use Topics   Alcohol use: Not Currently     Current Outpatient Medications:    cholecalciferol (VITAMIN D3) 25 MCG (1000 UNIT) tablet, Take 1,000 Units by mouth daily., Disp: , Rfl:    DULoxetine  (CYMBALTA ) 60 MG capsule, Take 1 capsule (60 mg total) by mouth daily., Disp: 90 capsule, Rfl: 1  glycopyrrolate  (ROBINUL ) 1 MG tablet, Take 1 tablet (1 mg total) by mouth 2 (two) times daily as needed., Disp: 60 tablet, Rfl: 3   RABEprazole  (ACIPHEX ) 20 MG tablet, TAKE 1 TABLET BY MOUTH DAILY, Disp: 30 tablet, Rfl: 0   rosuvastatin  (CRESTOR ) 10 MG tablet, Take 1 tablet (10 mg total) by mouth daily., Disp: 90 tablet, Rfl: 1   valsartan  (DIOVAN ) 320 MG tablet, Take 1 tablet (320 mg total) by mouth daily., Disp: 90 tablet, Rfl: 1   empagliflozin  (JARDIANCE ) 25 MG TABS tablet, Take 1 tablet (25 mg total) by mouth daily. (Patient not taking: Reported on 01/14/2024), Disp: 90 tablet, Rfl: 1  No Known Allergies  I personally reviewed active problem list, medication list, allergies, family history with the patient/caregiver today.   ROS  Ten systems reviewed and is negative except as  mentioned in HPI    Objective  Vitals:   01/14/24 0826  BP: 134/76  Pulse: 96  Resp: 16  SpO2: 97%  Weight: 160 lb 8 oz (72.8 kg)  Height: 5' 6 (1.676 m)    Body mass index is 25.91 kg/m.  Physical Exam  Constitutional: Patient appears well-developed and well-nourished. Obese  No distress.  HEENT: head atraumatic, normocephalic, pupils equal and reactive to light,, neck supple, throat within normal limits Cardiovascular: Normal rate, regular rhythm and normal heart sounds.  No murmur heard. No BLE edema. Pulmonary/Chest: Effort normal and breath sounds normal. No respiratory distress. Abdominal: Soft.  There is no tenderness. Psychiatric: Patient has a normal mood and affect. behavior is normal. Judgment and thought content normal.   Diabetic Foot Exam:     PHQ2/9:    01/14/2024    8:23 AM 09/24/2023    8:40 AM 06/25/2023    2:56 PM 02/26/2023   10:47 AM 01/21/2023   11:44 AM  Depression screen PHQ 2/9  Decreased Interest 0 0 0 0 0  Down, Depressed, Hopeless 0 0 0 0 0  PHQ - 2 Score 0 0 0 0 0  Altered sleeping 0 0 0 0 1  Tired, decreased energy 0 0 0 0 0  Change in appetite 0 0 0 0 0  Feeling bad or failure about yourself  0 0 0 0 0  Trouble concentrating 0 0 0 0 0  Moving slowly or fidgety/restless 0 0 0 0 0  Suicidal thoughts 0 0 0 0 0  PHQ-9 Score 0 0 0 0 1  Difficult doing work/chores Not difficult at all  Not difficult at all      phq 9 is negative  Fall Risk:    01/14/2024    8:23 AM 09/24/2023    8:42 AM 06/25/2023    2:56 PM 02/26/2023   10:39 AM 01/21/2023   11:44 AM  Fall Risk   Falls in the past year? 0 0 0 0 0  Number falls in past yr: 0  0  0  Injury with Fall? 0  0  0  Risk for fall due to : No Fall Risks No Fall Risks No Fall Risks No Fall Risks No Fall Risks  Follow up Falls prevention discussed;Education provided;Falls evaluation completed Falls prevention discussed;Education provided;Falls evaluation completed Falls prevention  discussed;Education provided;Falls evaluation completed Falls prevention discussed Falls prevention discussed     Assessment & Plan  1. Dyslipidemia associated with type 2 diabetes mellitus (HCC) (Primary)  - POCT glycosylated hemoglobin (Hb A1C)  2. Other ulcerative colitis without complication (HCC)  Sees GI  3. Hypertension, benign  BP is at goal today, offered to give her Hydralazine to take prn when stressed but she states she will be okay    4. GAD (generalized anxiety disorder)  Stable

## 2024-01-17 ENCOUNTER — Other Ambulatory Visit: Payer: Self-pay

## 2024-01-19 ENCOUNTER — Other Ambulatory Visit: Payer: Self-pay

## 2024-01-19 MED ORDER — ALBUTEROL SULFATE HFA 108 (90 BASE) MCG/ACT IN AERS
2.0000 | INHALATION_SPRAY | RESPIRATORY_TRACT | 0 refills | Status: DC | PRN
  Filled 2024-01-19: qty 6.7, 30d supply, fill #0

## 2024-01-19 MED ORDER — EASIVENT MISC
2 refills | Status: DC
  Filled 2024-01-19: qty 1, 1d supply, fill #0

## 2024-01-19 MED ORDER — AMOXICILLIN-POT CLAVULANATE 875-125 MG PO TABS
875.0000 mg | ORAL_TABLET | Freq: Two times a day (BID) | ORAL | 0 refills | Status: AC
  Filled 2024-01-19: qty 14, 7d supply, fill #0

## 2024-01-19 MED ORDER — PREDNISONE 20 MG PO TABS
40.0000 mg | ORAL_TABLET | Freq: Every day | ORAL | 0 refills | Status: AC
Start: 2024-01-19 — End: 2024-01-24
  Filled 2024-01-19: qty 10, 5d supply, fill #0

## 2024-01-20 ENCOUNTER — Other Ambulatory Visit: Payer: Self-pay

## 2024-01-28 LAB — HM DIABETES EYE EXAM

## 2024-02-02 ENCOUNTER — Other Ambulatory Visit: Payer: Self-pay

## 2024-04-07 ENCOUNTER — Other Ambulatory Visit: Payer: Self-pay

## 2024-04-14 ENCOUNTER — Ambulatory Visit: Payer: Self-pay | Admitting: Family Medicine

## 2024-04-14 ENCOUNTER — Other Ambulatory Visit: Payer: Self-pay

## 2024-04-14 ENCOUNTER — Encounter: Payer: Self-pay | Admitting: Family Medicine

## 2024-04-14 VITALS — BP 136/72 | HR 116 | Resp 16 | Ht 66.0 in | Wt 164.0 lb

## 2024-04-14 DIAGNOSIS — K5180 Other ulcerative colitis without complications: Secondary | ICD-10-CM | POA: Diagnosis not present

## 2024-04-14 DIAGNOSIS — E785 Hyperlipidemia, unspecified: Secondary | ICD-10-CM

## 2024-04-14 DIAGNOSIS — F411 Generalized anxiety disorder: Secondary | ICD-10-CM

## 2024-04-14 DIAGNOSIS — I10 Essential (primary) hypertension: Secondary | ICD-10-CM

## 2024-04-14 DIAGNOSIS — E1169 Type 2 diabetes mellitus with other specified complication: Secondary | ICD-10-CM

## 2024-04-14 DIAGNOSIS — I471 Supraventricular tachycardia, unspecified: Secondary | ICD-10-CM | POA: Diagnosis not present

## 2024-04-14 DIAGNOSIS — K589 Irritable bowel syndrome without diarrhea: Secondary | ICD-10-CM

## 2024-04-14 LAB — POCT GLYCOSYLATED HEMOGLOBIN (HGB A1C): Hemoglobin A1C: 8.1 % — AB (ref 4.0–5.6)

## 2024-04-14 MED ORDER — RYBELSUS 3 MG PO TABS: 3.0000 mg | ORAL_TABLET | Freq: Every day | ORAL | Status: DC

## 2024-04-14 MED ORDER — RYBELSUS 7 MG PO TABS
7.0000 mg | ORAL_TABLET | Freq: Every day | ORAL | 0 refills | Status: DC
Start: 2024-04-14 — End: 2024-10-31
  Filled 2024-04-14 – 2024-04-25 (×2): qty 90, 90d supply, fill #0

## 2024-04-14 MED ORDER — VALSARTAN 320 MG PO TABS
320.0000 mg | ORAL_TABLET | Freq: Every day | ORAL | 1 refills | Status: DC
  Filled 2024-04-14 – 2024-07-11 (×2): qty 90, 90d supply, fill #0
  Filled 2024-10-07: qty 90, 90d supply, fill #1

## 2024-04-14 MED ORDER — DULOXETINE HCL 60 MG PO CPEP
60.0000 mg | ORAL_CAPSULE | Freq: Every day | ORAL | 1 refills | Status: DC
  Filled 2024-04-14 – 2024-04-25 (×2): qty 90, 90d supply, fill #0
  Filled 2024-07-20: qty 90, 90d supply, fill #1

## 2024-04-14 MED ORDER — ROSUVASTATIN CALCIUM 10 MG PO TABS
10.0000 mg | ORAL_TABLET | Freq: Every day | ORAL | 1 refills | Status: DC
  Filled 2024-04-14: qty 90, 90d supply, fill #0
  Filled 2024-04-25 (×2): qty 45, 45d supply, fill #0
  Filled 2024-07-20: qty 90, 90d supply, fill #1

## 2024-04-14 NOTE — Progress Notes (Signed)
 Name: Christina Bradley   MRN: [id number]    DOB: [birth year]   Date:04/14/2024       Progress Note  Subjective  Chief Complaint  Chief Complaint  Patient presents with   Medical Management of Chronic Issues   Discussed the use of AI scribe software for clinical note transcription with the patient, who gave verbal consent to proceed.  History of Present Illness Christina Bradley is a 63 year old female with diabetes who presents for a six-month follow-up.  She has a history of diabetes diagnosed in October 2023 with an initial A1c of 7.4. Her A1c improved to 6.8 but has been increasing since February of last year, with recent values of 7.5, 7.8, 7.7, and currently 8.1. She experiences fatigue and occasional vision changes. Her weight has increased to 164 pounds from 156 pounds in March of last year. She has tried holistic treatments including 'blood synergy' and a high-fiber supplement, but her A1c remains uncontrolled. She has tried Rybelsus  in the past but it caused indigestion, SGL-2 agonist caused yeast vaginitis. Discussed Actos, Metformin, GLP-1 weekly but patient wants to try Rybelsus  again.   She has  ulcerative colitis and takes medication as needed for symptoms like cramping. She reports no current issues with diarrhea or blood in stools. She also uses omeprazole for reflux, which she buys over the counter, and reports no heartburn or indigestion. Under the care of GI  Her current medications include duloxetine  for anxiety and IBS symptoms, valsartan  for blood pressure, and rosuvastatin  for cholesterol. She reports no muscle aches from rosuvastatin  and her cholesterol levels have improved significantly since October 2023.  She has a history of palpitations and SVT on hooter done in the past for evaluation for palpitation. She reports a heart rate of 116 during the visit, which she attributes to rushing to the appointment. She is scheduled to see a cardiologist in June for further  evaluation.  Socially, she lives alone in a large house she has been in for 31 years. She is accustomed to the space and prefers not to downsize. She regularly cares for her grandchildren, with her granddaughter spending Thursday nights and Fridays with her, and her grandson joining on Friday afternoons. She is a widow    Patient Active Problem List   Diagnosis Date Noted   Dyslipidemia associated with type 2 diabetes mellitus (HCC) 09/23/2022   Palpitations 12/31/2020   White coat hypertension 11/29/2015   GAD (generalized anxiety disorder) 11/29/2015   GERD without esophagitis 11/29/2015   IBS (irritable bowel syndrome) 09/07/2008   Irritable bowel syndrome without diarrhea 09/07/2008   Ulcerative colitis (HCC) 09/06/2008    Past Surgical History:  Procedure Laterality Date   NASAL ENDOSCOPY W/ BALLON SINUPLASTY     NASAL SINUS SURGERY  2015    Family History  Problem Relation Age of Onset   Hyperlipidemia Mother    COPD Father    Allergies Son    Colon cancer Neg Hx    Stomach cancer Neg Hx    Esophageal cancer Neg Hx    Pancreatic cancer Neg Hx    Liver disease Neg Hx    Rectal cancer Neg Hx     Social History   Tobacco Use   Smoking status: Never   Smokeless tobacco: Never  Substance Use Topics   Alcohol use: Not Currently     Current Outpatient Medications:    albuterol  (VENTOLIN  HFA) 108 (90 Base) MCG/ACT inhaler, Inhale 2 puffs into the lungs every  4 (four) hours as needed for wheezing., Disp: 6.7 g, Rfl: 0   cholecalciferol (VITAMIN D3) 25 MCG (1000 UNIT) tablet, Take 1,000 Units by mouth daily., Disp: , Rfl:    glycopyrrolate  (ROBINUL ) 1 MG tablet, Take 1 tablet (1 mg total) by mouth 2 (two) times daily as needed., Disp: 60 tablet, Rfl: 3   omeprazole (PRILOSEC OTC) 20 MG tablet, Take 20 mg by mouth daily., Disp: , Rfl:    Semaglutide  (RYBELSUS ) 3 MG TABS, Take 1 tablet (3 mg total) by mouth daily., Disp: , Rfl:    Semaglutide  (RYBELSUS ) 7 MG TABS, Take 1  tablet (7 mg total) by mouth daily., Disp: 90 tablet, Rfl: 0   Spacer/Aero-Holding Chambers (EASIVENT) inhaler, Use as directed with inhaler., Disp: 1 each, Rfl: 2   DULoxetine  (CYMBALTA ) 60 MG capsule, Take 1 capsule (60 mg total) by mouth daily., Disp: 90 capsule, Rfl: 1   rosuvastatin  (CRESTOR ) 10 MG tablet, Take 1 tablet (10 mg total) by mouth daily., Disp: 90 tablet, Rfl: 1   valsartan  (DIOVAN ) 320 MG tablet, Take 1 tablet (320 mg total) by mouth daily., Disp: 90 tablet, Rfl: 1  No Known Allergies  I personally reviewed active problem list, medication list, allergies with the patient/caregiver today.   ROS  Ten systems reviewed and is negative except as mentioned in HPI    Objective Physical Exam  CONSTITUTIONAL: Patient appears well-developed and well-nourished. No distress. HEENT: Head atraumatic, normocephalic, neck supple. CARDIOVASCULAR: Tachycardia with heart rate at 116 bpm. Regular rhythm and normal heart sounds. No murmur heard. No BLE edema. PULMONARY: Effort normal and breath sounds normal. No respiratory distress. ABDOMINAL: There is no tenderness or distention. MUSCULOSKELETAL: Normal gait. Without gross motor or sensory deficit. PSYCHIATRIC: Patient has a normal mood and affect. Behavior is normal. Judgment and thought content normal.  Vitals:   04/14/24 1503  BP: 136/72  Pulse: (!) 116  Resp: 16  SpO2: 96%  Weight: 164 lb (74.4 kg)  Height: 5\' 6"  (1.676 m)    Body mass index is 26.47 kg/m.  Recent Results (from the past 2160 hours)  HM DIABETES EYE EXAM     Status: Abnormal   Collection Time: 01/28/24  2:50 PM  Result Value Ref Range   HM Diabetic Eye Exam Retinopathy (A) No Retinopathy    Comment: ABSTRACTED BY HIM  POCT HgB A1C     Status: Abnormal   Collection Time: 04/14/24  3:18 PM  Result Value Ref Range   Hemoglobin A1C 8.1 (A) 4.0 - 5.6 %   HbA1c POC (<> result, manual entry)     HbA1c, POC (prediabetic range)     HbA1c, POC (controlled  diabetic range)      Diabetic Foot Exam:  Diabetic Foot Exam - Simple   Simple Foot Form Visual Inspection No deformities, no ulcerations, no other skin breakdown bilaterally: Yes Sensation Testing Intact to touch and monofilament testing bilaterally: Yes Pulse Check Posterior Tibialis and Dorsalis pulse intact bilaterally: Yes Comments      PHQ2/9:    01/14/2024    8:23 AM 09/24/2023    8:40 AM 06/25/2023    2:56 PM 02/26/2023   10:47 AM 01/21/2023   11:44 AM  Depression screen PHQ 2/9  Decreased Interest 0 0 0 0 0  Down, Depressed, Hopeless 0 0 0 0 0  PHQ - 2 Score 0 0 0 0 0  Altered sleeping 0 0 0 0 1  Tired, decreased energy 0 0 0 0 0  Change in appetite 0 0 0 0 0  Feeling bad or failure about yourself  0 0 0 0 0  Trouble concentrating 0 0 0 0 0  Moving slowly or fidgety/restless 0 0 0 0 0  Suicidal thoughts 0 0 0 0 0  PHQ-9 Score 0 0 0 0 1  Difficult doing work/chores Not difficult at all  Not difficult at all      phq 9 is negative  Fall Risk:    01/14/2024    8:23 AM 09/24/2023    8:42 AM 06/25/2023    2:56 PM 02/26/2023   10:39 AM 01/21/2023   11:44 AM  Fall Risk   Falls in the past year? 0 0 0 0 0  Number falls in past yr: 0  0  0  Injury with Fall? 0  0  0  Risk for fall due to : No Fall Risks No Fall Risks No Fall Risks No Fall Risks No Fall Risks  Follow up Falls prevention discussed;Education provided;Falls evaluation completed Falls prevention discussed;Education provided;Falls evaluation completed Falls prevention discussed;Education provided;Falls evaluation completed Falls prevention discussed Falls prevention discussed     Assessment & Plan Type 2 diabetes mellitus with HTN/dyslipidemia  A1c increased to 8.1%, indicating uncontrolled diabetes. Previous treatments ineffective. Rybelsus  chosen due to side effects from other medications. - Restart Rybelsus  at 3 mg, increase to 7 mg after one month. - Consider adding metformin if A1c does not  improve. - Schedule follow-up in four months to reassess A1c and adjust treatment.  Hypertension Blood pressure controlled with valsartan . Elevated heart rate likely due to anxiety or rushing. Previous Holter monitor showed sinus tachycardia with rare PVCs and PACs. - Continue valsartan  for blood pressure control. - Monitor heart rate and consider beta blocker if palpitations persist. - Follow up with cardiologist in June for further evaluation.  Supraventricular tachycardia Episodes of sinus tachycardia and AV block. Previous Holter monitor showed sinus tachycardia with rare PVCs and PACs. - Follow up with cardiologist in June for further evaluation and management.  Generalized anxiety disorder Anxiety may contribute to elevated heart rate. Duloxetine  effective for anxiety management. - Continue duloxetine  for anxiety management.  Hyperlipidemia Cholesterol levels improved with rosuvastatin . LDL reduced to 84 mg/dL. - Continue rosuvastatin  10 mg daily.  Ulcerative colitis in remission Currently in remission with no symptoms. Occasional IBS symptoms Robinul   - Continue current management and use [place] as needed for IBS symptoms.

## 2024-04-19 ENCOUNTER — Encounter: Payer: Self-pay | Admitting: Family Medicine

## 2024-04-19 ENCOUNTER — Other Ambulatory Visit: Payer: Self-pay

## 2024-04-19 DIAGNOSIS — E1169 Type 2 diabetes mellitus with other specified complication: Secondary | ICD-10-CM

## 2024-04-21 ENCOUNTER — Other Ambulatory Visit: Payer: Self-pay | Admitting: Family Medicine

## 2024-04-21 DIAGNOSIS — E1169 Type 2 diabetes mellitus with other specified complication: Secondary | ICD-10-CM

## 2024-04-24 ENCOUNTER — Other Ambulatory Visit: Payer: Self-pay

## 2024-04-25 ENCOUNTER — Other Ambulatory Visit: Payer: Self-pay

## 2024-06-12 ENCOUNTER — Other Ambulatory Visit: Payer: Self-pay

## 2024-06-12 MED ORDER — CIPROFLOXACIN HCL 500 MG PO TABS
500.0000 mg | ORAL_TABLET | Freq: Two times a day (BID) | ORAL | 0 refills | Status: DC
  Filled 2024-06-12: qty 14, 7d supply, fill #0

## 2024-07-11 ENCOUNTER — Other Ambulatory Visit: Payer: Self-pay

## 2024-07-20 ENCOUNTER — Other Ambulatory Visit: Payer: Self-pay

## 2024-07-20 MED ORDER — RYBELSUS 14 MG PO TABS
1.0000 | ORAL_TABLET | Freq: Every day | ORAL | 12 refills | Status: DC
  Filled 2024-07-20: qty 30, 30d supply, fill #0
  Filled 2024-09-18: qty 30, 30d supply, fill #1

## 2024-08-25 ENCOUNTER — Ambulatory Visit: Admitting: Family Medicine

## 2024-09-18 ENCOUNTER — Other Ambulatory Visit: Payer: Self-pay

## 2024-09-19 ENCOUNTER — Other Ambulatory Visit: Payer: Self-pay

## 2024-09-19 MED ORDER — RYBELSUS 14 MG PO TABS
14.0000 mg | ORAL_TABLET | Freq: Every day | ORAL | 4 refills | Status: AC
  Filled 2024-09-19: qty 90, 90d supply, fill #0

## 2024-10-16 ENCOUNTER — Other Ambulatory Visit: Payer: Self-pay

## 2024-10-19 LAB — HEMOGLOBIN A1C: Hemoglobin A1C: 6.4

## 2024-10-27 ENCOUNTER — Other Ambulatory Visit: Payer: Self-pay | Admitting: Family Medicine

## 2024-10-27 DIAGNOSIS — K589 Irritable bowel syndrome without diarrhea: Secondary | ICD-10-CM

## 2024-10-27 DIAGNOSIS — F411 Generalized anxiety disorder: Secondary | ICD-10-CM

## 2024-10-29 NOTE — Telephone Encounter (Signed)
 Requested medication (s) are due for refill today -yes  Requested medication (s) are on the active medication list -yes  Future visit scheduled -yes  Last refill: 04/14/24 #90 1RF  Notes to clinic: fails lab protocol- over 1 year-09/24/23, fails visit protocol  Requested Prescriptions  Pending Prescriptions Disp Refills   DULoxetine  (CYMBALTA ) 60 MG capsule 90 capsule 1    Sig: Take 1 capsule (60 mg total) by mouth daily.     Psychiatry: Antidepressants - SNRI - duloxetine  Failed - 10/29/2024 10:55 AM      Failed - Cr in normal range and within 360 days    Creat  Date Value Ref Range Status  09/24/2023 0.75 0.50 - 1.05 mg/dL Final   Creatinine, Urine  Date Value Ref Range Status  09/24/2023 100 20 - 275 mg/dL Final         Failed - eGFR is 30 or above and within 360 days    GFR, Est African American  Date Value Ref Range Status  02/02/2019 70 > OR = 60 mL/min/1.78m2 Final   GFR, Est Non African American  Date Value Ref Range Status  02/02/2019 61 > OR = 60 mL/min/1.46m2 Final   GFR, Estimated  Date Value Ref Range Status  01/15/2023 >60 >60 mL/min Final    Comment:    (NOTE) Calculated using the CKD-EPI Creatinine Equation (2021)    eGFR  Date Value Ref Range Status  09/24/2023 91 > OR = 60 mL/min/1.83m2 Final         Failed - Valid encounter within last 6 months    Recent Outpatient Visits           6 months ago Dyslipidemia associated with type 2 diabetes mellitus [facility])   [place] [facility] Glenard Mire, MD   9 months ago Dyslipidemia associated with type 2 diabetes mellitus [facility])   [place] [facility] Glenard Mire, MD              Passed - Completed PHQ-2 or PHQ-9 in the last 360 days      Passed - Last BP in normal range    BP Readings from Last 1 Encounters:  04/14/24 136/72            Requested Prescriptions  Pending Prescriptions Disp Refills   DULoxetine  (CYMBALTA ) 60 MG capsule 90 capsule 1     Sig: Take 1 capsule (60 mg total) by mouth daily.     Psychiatry: Antidepressants - SNRI - duloxetine  Failed - 10/29/2024 10:55 AM      Failed - Cr in normal range and within 360 days    Creat  Date Value Ref Range Status  09/24/2023 0.75 0.50 - 1.05 mg/dL Final   Creatinine, Urine  Date Value Ref Range Status  09/24/2023 100 20 - 275 mg/dL Final         Failed - eGFR is 30 or above and within 360 days    GFR, Est African American  Date Value Ref Range Status  02/02/2019 70 > OR = 60 mL/min/1.12m2 Final   GFR, Est Non African American  Date Value Ref Range Status  02/02/2019 61 > OR = 60 mL/min/1.33m2 Final   GFR, Estimated  Date Value Ref Range Status  01/15/2023 >60 >60 mL/min Final    Comment:    (NOTE) Calculated using the CKD-EPI Creatinine Equation (2021)    eGFR  Date Value Ref Range Status  09/24/2023 91 > OR = 60 mL/min/1.16m2 Final  Failed - Valid encounter within last 6 months    Recent Outpatient Visits           6 months ago Dyslipidemia associated with type 2 diabetes mellitus [facility])   [place] [facility] Glenard Mire, MD   9 months ago Dyslipidemia associated with type 2 diabetes mellitus [facility])   [place] [facility] Carey, Krichna, MD              Passed - Completed PHQ-2 or PHQ-9 in the last 360 days      Passed - Last BP in normal range    BP Readings from Last 1 Encounters:  04/14/24 136/72

## 2024-10-30 ENCOUNTER — Other Ambulatory Visit: Payer: Self-pay

## 2024-10-31 ENCOUNTER — Other Ambulatory Visit: Payer: Self-pay

## 2024-10-31 ENCOUNTER — Encounter: Payer: Self-pay | Admitting: Family Medicine

## 2024-10-31 ENCOUNTER — Ambulatory Visit: Admitting: Family Medicine

## 2024-10-31 VITALS — BP 118/72 | HR 97 | Temp 97.8°F | Resp 16 | Ht 66.0 in | Wt 146.7 lb

## 2024-10-31 DIAGNOSIS — F411 Generalized anxiety disorder: Secondary | ICD-10-CM

## 2024-10-31 DIAGNOSIS — E785 Hyperlipidemia, unspecified: Secondary | ICD-10-CM

## 2024-10-31 DIAGNOSIS — Z7984 Long term (current) use of oral hypoglycemic drugs: Secondary | ICD-10-CM

## 2024-10-31 DIAGNOSIS — I152 Hypertension secondary to endocrine disorders: Secondary | ICD-10-CM

## 2024-10-31 DIAGNOSIS — E1159 Type 2 diabetes mellitus with other circulatory complications: Secondary | ICD-10-CM

## 2024-10-31 DIAGNOSIS — K589 Irritable bowel syndrome without diarrhea: Secondary | ICD-10-CM

## 2024-10-31 DIAGNOSIS — K219 Gastro-esophageal reflux disease without esophagitis: Secondary | ICD-10-CM

## 2024-10-31 DIAGNOSIS — E1169 Type 2 diabetes mellitus with other specified complication: Secondary | ICD-10-CM

## 2024-10-31 MED ORDER — DULOXETINE HCL 60 MG PO CPEP
60.0000 mg | ORAL_CAPSULE | Freq: Every day | ORAL | 3 refills | Status: AC
  Filled 2024-10-31: qty 90, 90d supply, fill #0

## 2024-10-31 MED ORDER — VALSARTAN 320 MG PO TABS
320.0000 mg | ORAL_TABLET | Freq: Every day | ORAL | 3 refills | Status: AC
  Filled 2024-10-31: qty 90, 90d supply, fill #0

## 2024-10-31 MED ORDER — ROSUVASTATIN CALCIUM 10 MG PO TABS
10.0000 mg | ORAL_TABLET | Freq: Every day | ORAL | 3 refills | Status: AC
  Filled 2024-10-31: qty 90, 90d supply, fill #0

## 2024-10-31 NOTE — Progress Notes (Signed)
 Name: Christina Bradley   MRN: [id number]    DOB: 11-13-1961   Date:10/31/2024       Progress Note  Subjective  Chief Complaint  Chief Complaint  Patient presents with   Medical Management of Chronic Issues   Discussed the use of AI scribe software for clinical note transcription with the patient, who gave verbal consent to proceed.  History of Present Illness Christina Bradley is a 63 year old female who presents for a follow-up visit.  Her type 2 diabetes is well-managed with a current A1c of 6.4. She is taking Rybelsus  14 mg daily, with a 90-day supply last filled in October. She has lost 20 pounds, bringing her weight to 146 pounds and her BMI to 23.68. She follows a diabetic diet, avoiding large portions and bread, and focuses on consuming protein, vegetables, and fruit.  She reports no current symptoms related to colitis and states her GI doctor advised she does not need follow-up for another three years. A colonoscopy in 2023 showed some polyps but no active colitis. She does not take any medication for IBS or constipation and reports she no longer takes omeprazole.  She is currently taking duloxetine  for anxiety and valsartan  for blood pressure management, with her blood pressure well-controlled at 118/72 mmHg. She also takes rosuvastatin  for cholesterol management, with her LDL levels now below 70.  In terms of social history, she lives independently and plans to spend Thanksgiving with family, who recently moved into a new house.    Patient Active Problem List   Diagnosis Date Noted   Hypertension associated with diabetes (HCC) 10/31/2024   Dyslipidemia associated with type 2 diabetes mellitus (HCC) 09/23/2022   Palpitations 12/31/2020   White coat hypertension 11/29/2015   GAD (generalized anxiety disorder) 11/29/2015   GERD without esophagitis 11/29/2015   Irritable bowel syndrome without diarrhea 09/07/2008   Ulcerative colitis (HCC) 09/06/2008    Past Surgical History:   Procedure Laterality Date   NASAL ENDOSCOPY W/ BALLON SINUPLASTY     NASAL SINUS SURGERY  2015    Family History  Problem Relation Age of Onset   Hyperlipidemia Mother    COPD Father    Allergies Son    Colon cancer Neg Hx    Stomach cancer Neg Hx    Esophageal cancer Neg Hx    Pancreatic cancer Neg Hx    Liver disease Neg Hx    Rectal cancer Neg Hx     Social History   Tobacco Use   Smoking status: Never   Smokeless tobacco: Never  Substance Use Topics   Alcohol use: Not Currently     Current Outpatient Medications:    cholecalciferol (VITAMIN D3) 25 MCG (1000 UNIT) tablet, Take 1,000 Units by mouth daily., Disp: , Rfl:    DULoxetine  (CYMBALTA ) 60 MG capsule, Take 1 capsule (60 mg total) by mouth daily., Disp: 90 capsule, Rfl: 1   omeprazole (PRILOSEC OTC) 20 MG tablet, Take 20 mg by mouth daily., Disp: , Rfl:    rosuvastatin  (CRESTOR ) 10 MG tablet, Take 1 tablet (10 mg total) by mouth daily., Disp: 90 tablet, Rfl: 1   Semaglutide  (RYBELSUS ) 14 MG TABS, Take 1 tablet (14 mg total) by mouth daily. Do not cut, crush, or chew, Disp: 90 tablet, Rfl: 4   Spacer/Aero-Holding Chambers (EASIVENT) inhaler, Use as directed with inhaler., Disp: 1 each, Rfl: 2   valsartan  (DIOVAN ) 320 MG tablet, Take 1 tablet (320 mg total) by mouth daily., Disp: 90 tablet,  Rfl: 1  No Known Allergies  I personally reviewed active problem list, medication list, allergies, family history with the patient/caregiver today.   ROS  Ten systems reviewed and is negative except as mentioned in HPI    Objective Physical Exam VITALS: BP- 118/72 MEASUREMENTS: Weight- 146, BMI- 23.68. CONSTITUTIONAL: Patient appears well-developed and well-nourished. No distress. HEENT: Head atraumatic, normocephalic, neck supple. CARDIOVASCULAR: Normal rate, regular rhythm and normal heart sounds. No murmur heard. No BLE edema. PULMONARY: Effort normal and breath sounds normal. No respiratory distress. ABDOMINAL:  There is no tenderness or distention. MUSCULOSKELETAL: Normal gait. Without gross motor or sensory deficit. PSYCHIATRIC: Patient has a normal mood and affect. Behavior is normal. Judgment and thought content normal.  Vitals:   10/31/24 1421  BP: 118/72  Pulse: 97  Resp: 16  Temp: 97.8 F (36.6 C)  TempSrc: Oral  SpO2: 100%  Weight: 146 lb 11.2 oz (66.5 kg)  Height: 5' 6 (1.676 m)    Body mass index is 23.68 kg/m.  Recent Results (from the past 2160 hours)  Hemoglobin A1c     Status: None   Collection Time: 10/19/24 12:00 AM  Result Value Ref Range   Hemoglobin A1C 6.4      PHQ2/9:    10/31/2024    2:15 PM 01/14/2024    8:23 AM 09/24/2023    8:40 AM 06/25/2023    2:56 PM 02/26/2023   10:47 AM  Depression screen PHQ 2/9  Decreased Interest 0 0 0 0 0  Down, Depressed, Hopeless 0 0 0 0 0  PHQ - 2 Score 0 0 0 0 0  Altered sleeping  0 0 0 0  Tired, decreased energy  0 0 0 0  Change in appetite  0 0 0 0  Feeling bad or failure about yourself   0 0 0 0  Trouble concentrating  0 0 0 0  Moving slowly or fidgety/restless  0 0 0 0  Suicidal thoughts  0 0 0 0  PHQ-9 Score  0  0  0  0   Difficult doing work/chores  Not difficult at all  Not difficult at all      Data saved with a previous flowsheet row definition    phq 9 is negative  Fall Risk:    10/31/2024    2:15 PM 01/14/2024    8:23 AM 09/24/2023    8:42 AM 06/25/2023    2:56 PM 02/26/2023   10:39 AM  Fall Risk   Falls in the past year? 0 0 0 0 0  Number falls in past yr: 0 0  0   Injury with Fall? 0 0  0   Risk for fall due to : No Fall Risks No Fall Risks No Fall Risks No Fall Risks No Fall Risks  Follow up Falls evaluation completed Falls prevention discussed;Education provided;Falls evaluation completed Falls prevention discussed;Education provided;Falls evaluation completed Falls prevention discussed;Education provided;Falls evaluation completed Falls prevention discussed      Assessment & Plan Type 2  diabetes mellitus with associated HTN and dyslipidemia Well-controlled with A1c of 6.4%. - Continue Rybelsus  14 mg daily. - Maintain current diet and weight management strategies. - under the care of Endo   Hypertension Well-controlled with blood pressure 118/72 mmHg. - Continue valsartan  as prescribed. - Monitor for symptoms of hypotension.  Hyperlipidemia Well-managed with rosuvastatin . LDL below 70. - Continue rosuvastatin  as prescribed.  Generalized anxiety disorder Managed with duloxetine . Medication effective. - Continue duloxetine  as prescribed.  Gastroesophageal reflux  disease Managed with over-the-counter omeprazole as needed. - Use over-the-counter omeprazole as needed for reflux symptoms.  History of colitis and colon polyps Colitis well-controlled. Due for follow-up next year. - Contact GI doctor to schedule follow-up colonoscopy next year.  General Health Maintenance Up to date except for flu, pneumonia, and shingles vaccines. - Consider receiving flu, pneumonia, and shingles vaccines.

## 2024-11-08 ENCOUNTER — Other Ambulatory Visit: Payer: Self-pay

## 2024-11-08 ENCOUNTER — Other Ambulatory Visit (HOSPITAL_COMMUNITY): Payer: Self-pay

## 2024-11-10 ENCOUNTER — Other Ambulatory Visit: Payer: Self-pay | Admitting: Neurology

## 2024-11-10 DIAGNOSIS — Q670 Congenital facial asymmetry: Secondary | ICD-10-CM

## 2024-11-14 ENCOUNTER — Ambulatory Visit

## 2024-11-14 ENCOUNTER — Ambulatory Visit
Admission: RE | Admit: 2024-11-14 | Discharge: 2024-11-14 | Disposition: A | Source: Ambulatory Visit | Attending: Neurology | Admitting: Neurology

## 2024-11-14 ENCOUNTER — Other Ambulatory Visit: Payer: Self-pay | Admitting: Neurology

## 2024-11-14 DIAGNOSIS — Q670 Congenital facial asymmetry: Secondary | ICD-10-CM

## 2024-11-14 DIAGNOSIS — R42 Dizziness and giddiness: Secondary | ICD-10-CM

## 2024-11-16 ENCOUNTER — Ambulatory Visit: Admission: RE | Admit: 2024-11-16 | Discharge: 2024-11-16 | Attending: Neurology | Admitting: Neurology

## 2024-11-16 DIAGNOSIS — R42 Dizziness and giddiness: Secondary | ICD-10-CM | POA: Insufficient documentation
